# Patient Record
Sex: Male | Born: 1960 | Race: White | Hispanic: No | Marital: Married | Smoking: Never smoker
Health system: Southern US, Community
[De-identification: ages and names within clinical notes are randomized; demographics above are authoritative.]

## PROBLEM LIST (undated history)

## (undated) DIAGNOSIS — Z87442 Personal history of urinary calculi: Secondary | ICD-10-CM

---

## 2008-04-02 ENCOUNTER — Ambulatory Visit: Payer: Self-pay | Admitting: Gastroenterology

## 2009-11-05 ENCOUNTER — Other Ambulatory Visit: Payer: Self-pay | Admitting: Gastroenterology

## 2014-02-01 ENCOUNTER — Emergency Department: Payer: Self-pay | Admitting: Emergency Medicine

## 2014-02-01 LAB — CBC
HCT: 47.1 % (ref 40.0–52.0)
HGB: 16.1 g/dL (ref 13.0–18.0)
MCH: 30.6 pg (ref 26.0–34.0)
MCHC: 34.1 g/dL (ref 32.0–36.0)
MCV: 90 fL (ref 80–100)
Platelet: 244 10*3/uL (ref 150–440)
RBC: 5.25 10*6/uL (ref 4.40–5.90)
RDW: 13 % (ref 11.5–14.5)
WBC: 8.6 10*3/uL (ref 3.8–10.6)

## 2014-02-01 LAB — COMPREHENSIVE METABOLIC PANEL
ALBUMIN: 4.3 g/dL (ref 3.4–5.0)
Alkaline Phosphatase: 60 U/L
Anion Gap: 11 (ref 7–16)
BILIRUBIN TOTAL: 0.7 mg/dL (ref 0.2–1.0)
BUN: 11 mg/dL (ref 7–18)
CREATININE: 1.49 mg/dL — AB (ref 0.60–1.30)
Calcium, Total: 9.4 mg/dL (ref 8.5–10.1)
Chloride: 101 mmol/L (ref 98–107)
Co2: 24 mmol/L (ref 21–32)
EGFR (African American): >60
EGFR (Non-African Amer.): 53 — ABNORMAL LOW
GLUCOSE: 127 mg/dL — AB (ref 65–99)
Osmolality: 273 (ref 275–301)
POTASSIUM: 3.4 mmol/L — AB (ref 3.5–5.1)
SGOT(AST): 17 U/L (ref 15–37)
SGPT (ALT): 19 U/L
SODIUM: 136 mmol/L (ref 136–145)
Total Protein: 7.5 g/dL (ref 6.4–8.2)

## 2014-02-01 LAB — URINALYSIS, COMPLETE
Bacteria: NONE SEEN
Bilirubin,UR: NEGATIVE
GLUCOSE, UR: NEGATIVE mg/dL (ref 0–75)
Leukocyte Esterase: NEGATIVE
Nitrite: NEGATIVE
PH: 5 (ref 4.5–8.0)
Protein: NEGATIVE
Specific Gravity: 1.026 (ref 1.003–1.030)
Squamous Epithelial: NONE SEEN
WBC UR: 2 /HPF (ref 0–5)

## 2014-02-01 LAB — LIPASE, BLOOD: LIPASE: 112 U/L (ref 73–393)

## 2014-02-04 ENCOUNTER — Emergency Department: Payer: Self-pay | Admitting: Emergency Medicine

## 2014-02-04 LAB — CBC
HCT: 42.4 % (ref 40.0–52.0)
HGB: 14.4 g/dL (ref 13.0–18.0)
MCH: 30.4 pg (ref 26.0–34.0)
MCHC: 33.9 g/dL (ref 32.0–36.0)
MCV: 90 fL (ref 80–100)
Platelet: 160 10*3/uL (ref 150–440)
RBC: 4.73 10*6/uL (ref 4.40–5.90)
RDW: 12.9 % (ref 11.5–14.5)
WBC: 7.2 10*3/uL (ref 3.8–10.6)

## 2014-02-04 LAB — URINALYSIS, COMPLETE
BACTERIA: NONE SEEN
BILIRUBIN, UR: NEGATIVE
Blood: NEGATIVE
Glucose,UR: NEGATIVE mg/dL (ref 0–75)
Ketone: NEGATIVE
Leukocyte Esterase: NEGATIVE
NITRITE: NEGATIVE
Ph: 5 (ref 4.5–8.0)
Protein: NEGATIVE
RBC, UR: NONE SEEN /HPF (ref 0–5)
SQUAMOUS EPITHELIAL: NONE SEEN
Specific Gravity: 1.01 (ref 1.003–1.030)

## 2014-02-04 LAB — COMPREHENSIVE METABOLIC PANEL
AST: 15 U/L (ref 15–37)
Albumin: 3.9 g/dL (ref 3.4–5.0)
Alkaline Phosphatase: 46 U/L
Anion Gap: 5 — ABNORMAL LOW (ref 7–16)
BUN: 12 mg/dL (ref 7–18)
Bilirubin,Total: 1.2 mg/dL — ABNORMAL HIGH (ref 0.2–1.0)
CALCIUM: 9.9 mg/dL (ref 8.5–10.1)
CREATININE: 1.97 mg/dL — AB (ref 0.60–1.30)
Chloride: 100 mmol/L (ref 98–107)
Co2: 31 mmol/L (ref 21–32)
EGFR (African American): 44 — ABNORMAL LOW
EGFR (Non-African Amer.): 38 — ABNORMAL LOW
Glucose: 119 mg/dL — ABNORMAL HIGH (ref 65–99)
OSMOLALITY: 273 (ref 275–301)
Potassium: 3.7 mmol/L (ref 3.5–5.1)
SGPT (ALT): 21 U/L
Sodium: 136 mmol/L (ref 136–145)
Total Protein: 7.3 g/dL (ref 6.4–8.2)

## 2014-02-05 LAB — BASIC METABOLIC PANEL
ANION GAP: 8 (ref 7–16)
BUN: 14 mg/dL (ref 7–18)
Calcium, Total: 8.1 mg/dL — ABNORMAL LOW (ref 8.5–10.1)
Chloride: 106 mmol/L (ref 98–107)
Co2: 28 mmol/L (ref 21–32)
Creatinine: 1.89 mg/dL — ABNORMAL HIGH (ref 0.60–1.30)
EGFR (African American): 46 — ABNORMAL LOW
EGFR (Non-African Amer.): 40 — ABNORMAL LOW
Glucose: 98 mg/dL (ref 65–99)
OSMOLALITY: 284 (ref 275–301)
POTASSIUM: 4.1 mmol/L (ref 3.5–5.1)
Sodium: 142 mmol/L (ref 136–145)

## 2014-02-07 ENCOUNTER — Ambulatory Visit: Payer: Self-pay | Admitting: Urology

## 2014-02-08 ENCOUNTER — Ambulatory Visit: Payer: Self-pay | Admitting: Urology

## 2014-06-01 HISTORY — PX: OTHER SURGICAL HISTORY: SHX169

## 2016-06-19 IMAGING — CT CT STONE STUDY
2 of 4 series · 16 of 46 positions shown, 18 images · non-contrast
Comparison: None.

CLINICAL DATA: Left lower abdominal pain radiating to the left
flank.

EXAM:
CT ABDOMEN AND PELVIS WITHOUT CONTRAST
TECHNIQUE: Multidetector CT imaging of the abdomen and pelvis was performed
following the standard protocol without IV contrast.

[Series 2: stone standard full · axial · 0.72mm/px · z∈[-490,-50]mm · 13 of 97 slices shown, 15 images]
[im 5/97  soft-tissue]
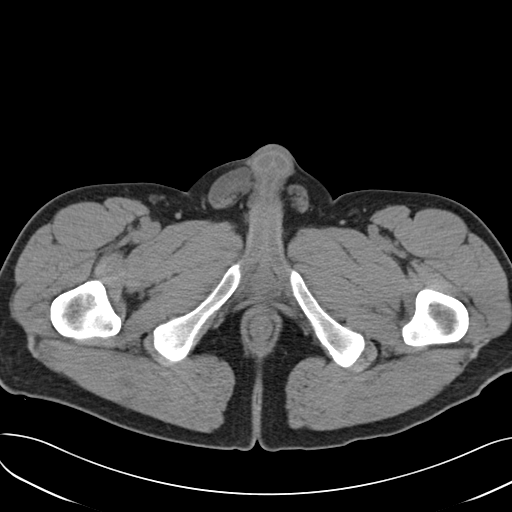
[im 5/97  bone]
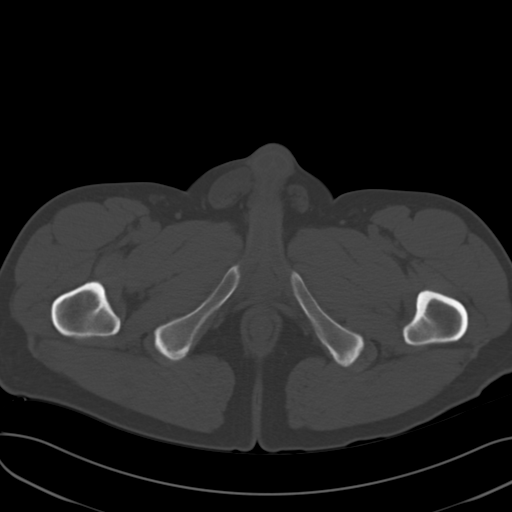
[im 13/97  soft-tissue]
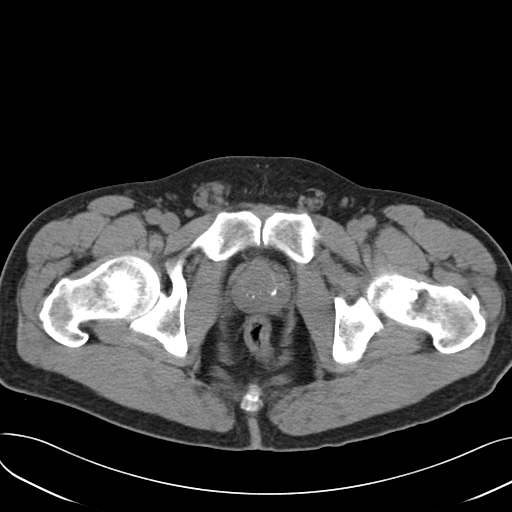
[im 21/97  soft-tissue]
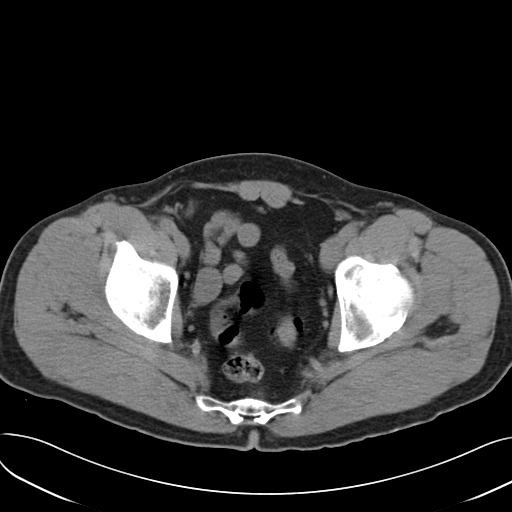
[im 29/97  soft-tissue]
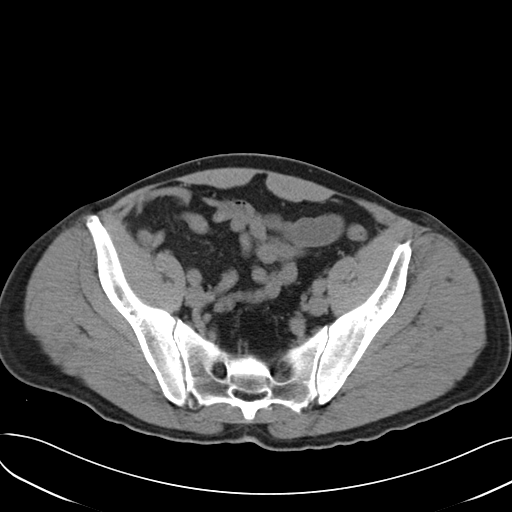
[im 33/97  soft-tissue]
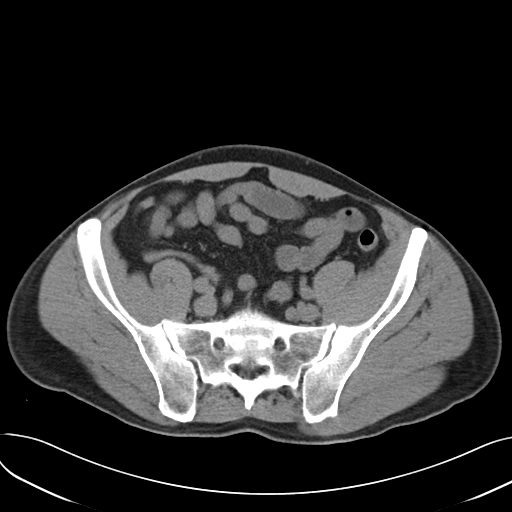
[im 41/97  soft-tissue]
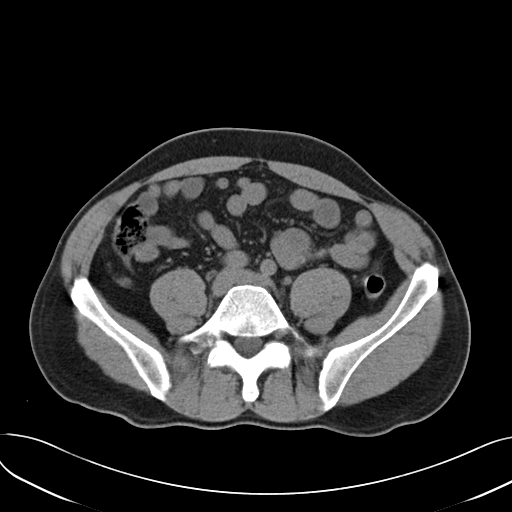
[im 49/97  soft-tissue]
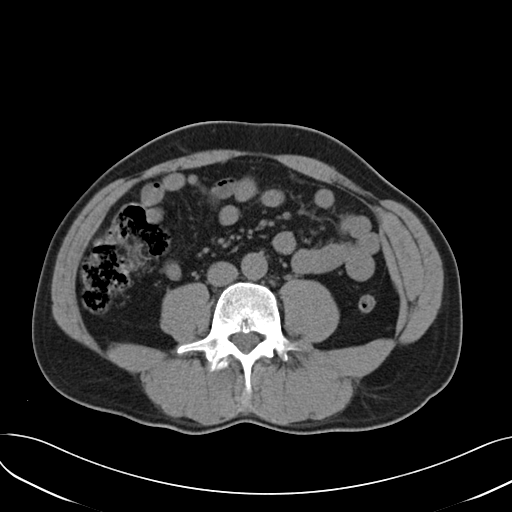
[im 57/97  soft-tissue]
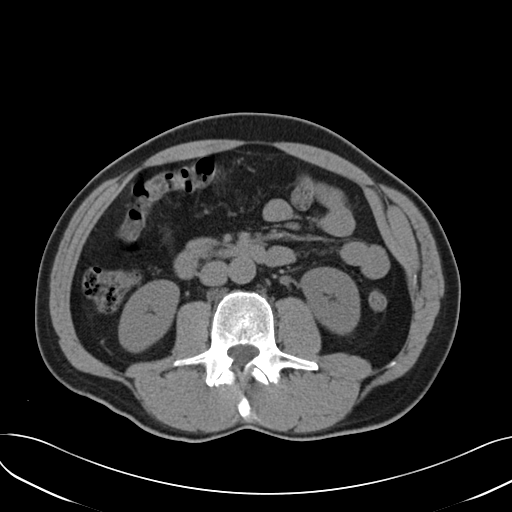
[im 65/97  soft-tissue]
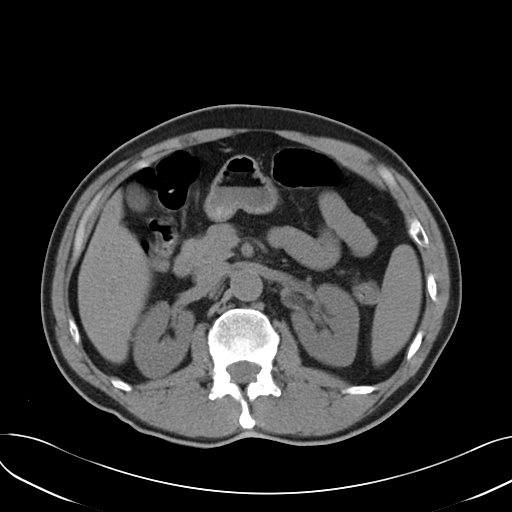
[im 65/97  bone]
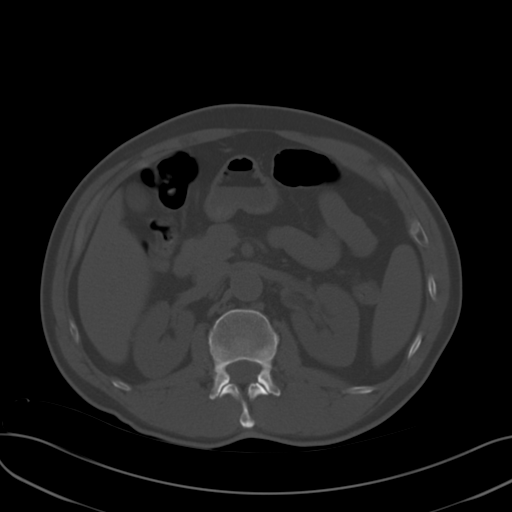
[im 69/97  soft-tissue]
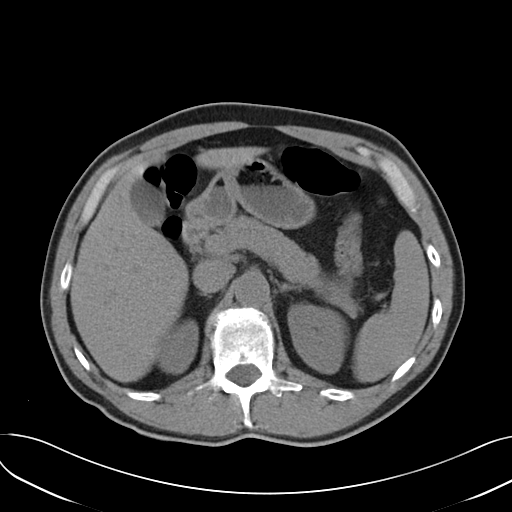
[im 77/97  soft-tissue]
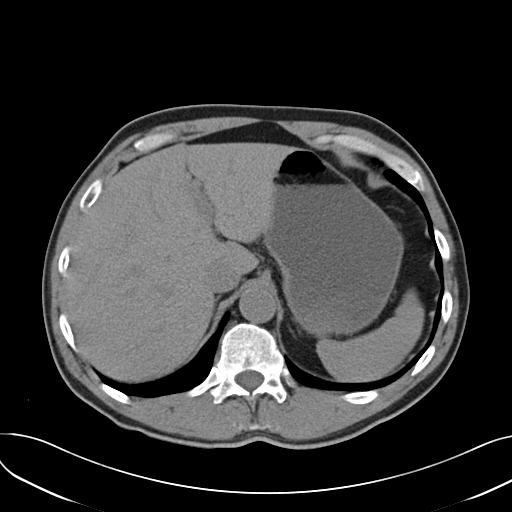
[im 85/97  soft-tissue]
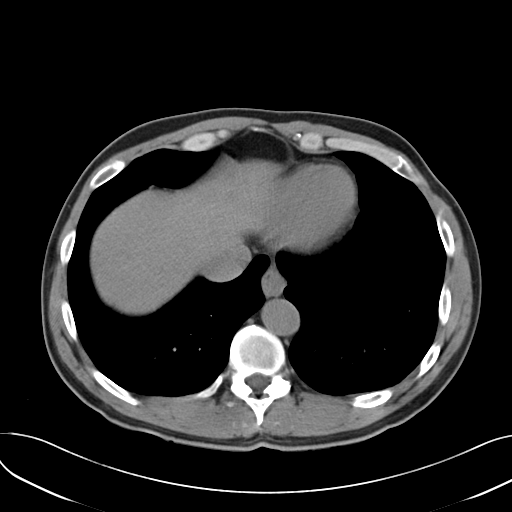
[im 93/97  soft-tissue]
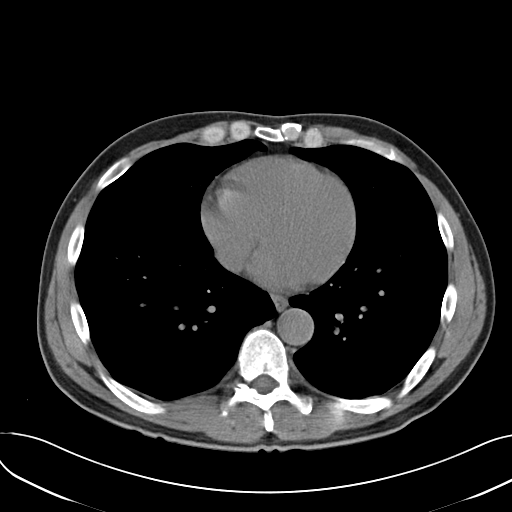

[Series 5: cor stone standard full · coronal · 0.66mm/px · 3 of 116 slices shown]
[im 39/116  soft-tissue]
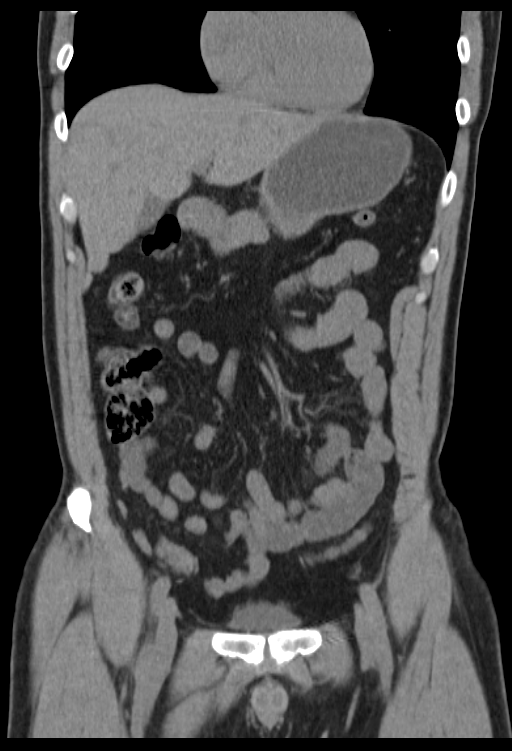
[im 52/116  soft-tissue]
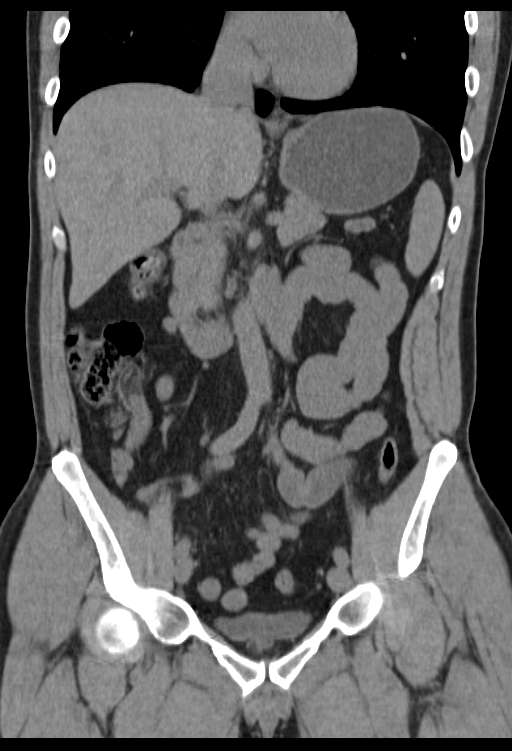
[im 64/116  soft-tissue]
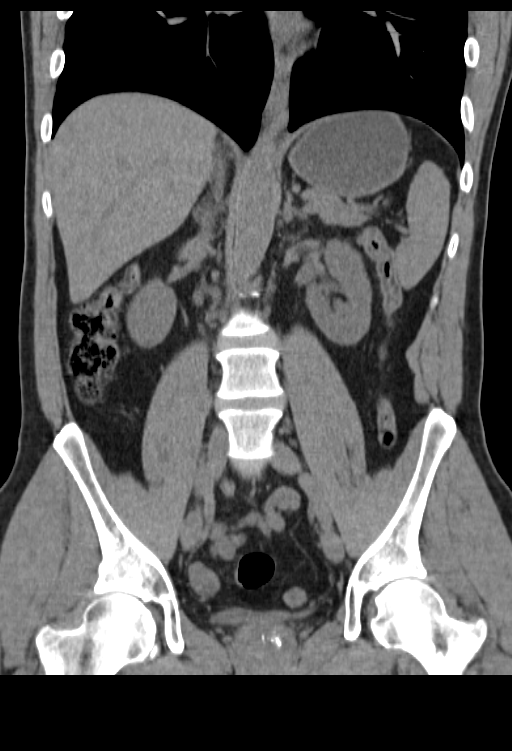

[16 of 46 positions shown; findings below may reference images not displayed]

FINDINGS: Visualization of the lower thorax demonstrates no consolidative or
nodular pulmonary opacities. Normal heart size.

Lack of intravenous contrast material limits evaluation the solid
organ parenchyma.

The noncontrast enhanced liver, gallbladder, spleen and pancreas are
unremarkable. Normal adrenal glands.

There is a 3 mm stone within the distal left ureter (image 78;
series 2). Mild left hydroureteronephrosis with periureteric fat
stranding. No additional nephrolithiasis.

Normal caliber abdominal aorta. No retroperitoneal lymphadenopathy.
Central dystrophic calcifications in the prostate. Bladder is
decompressed. Small splenule. Normal appendix. No abnormal bowel
wall thickening or evidence for bowel obstruction. No free fluid or
free intraperitoneal air. The

No aggressive or acute appearing osseous lesions.
IMPRESSION: 3 mm stone within the distal left ureter with mild left
hydroureteronephrosis.

## 2016-06-26 IMAGING — CR DG ABDOMEN 1V
1 series · 2 of 2 positions shown · non-contrast
Comparison: 02/04/2014 and CT 02/01/2014

CLINICAL DATA: Left kidney stone.

EXAM:
ABDOMEN - 1 VIEW

[Series 1: dxr kidney ureter bladder · 0.14mm/px · 2 of 2 slices shown]
[im 1/2]
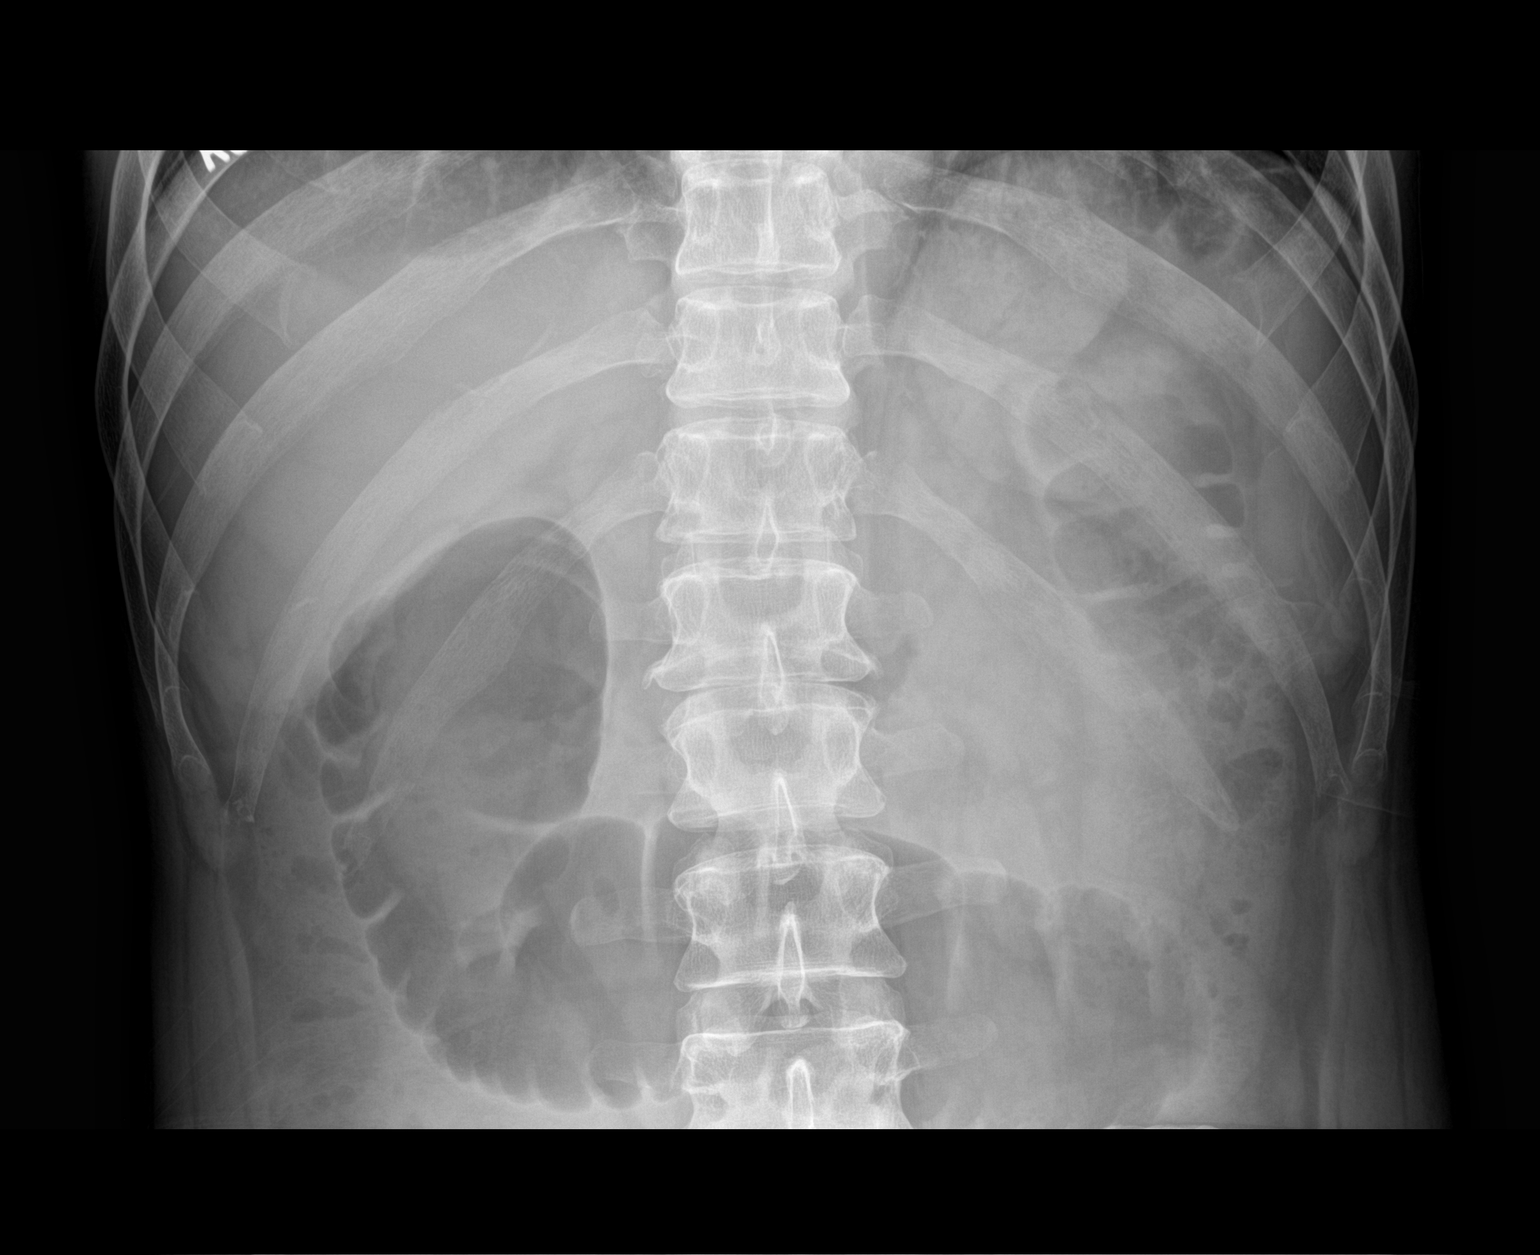
[im 2/2]
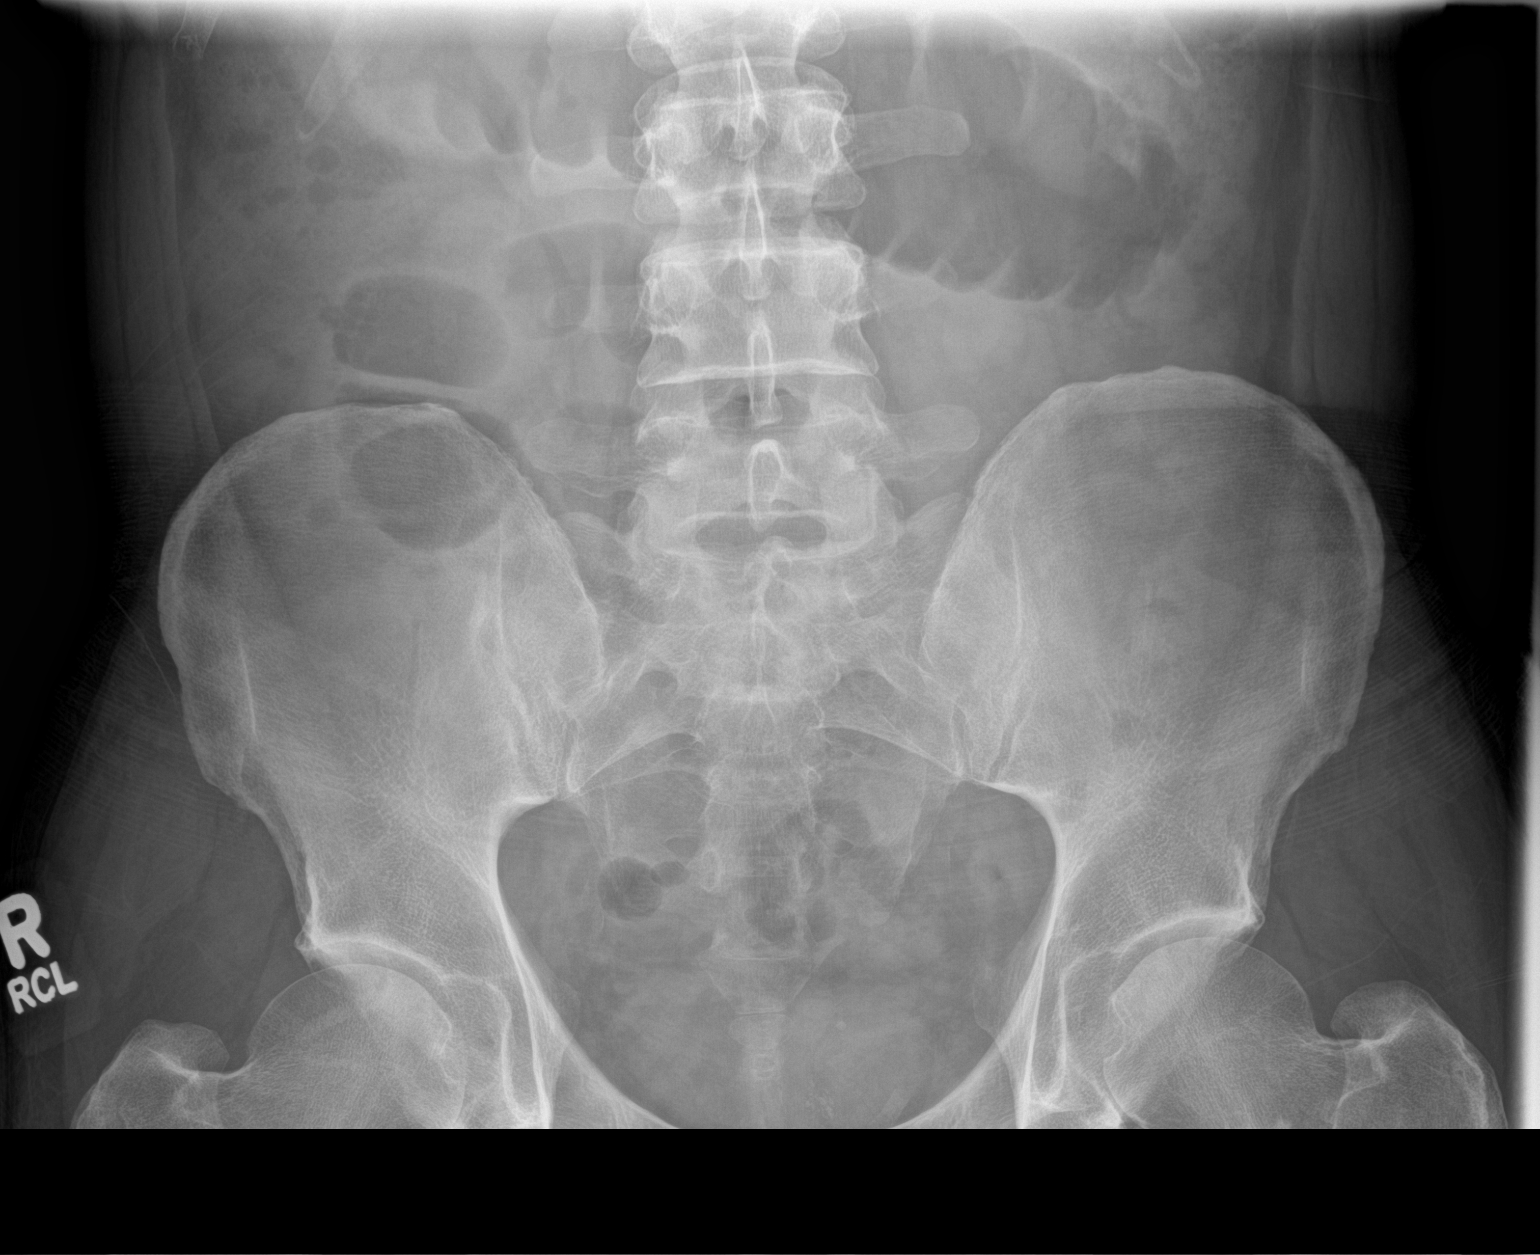

[2 of 2 positions shown; findings below may reference images not displayed]

FINDINGS: Bowel gas pattern is nonobstructive. There are no calcifications
present over the kidneys. Again noted is a 3 mm calcification over
the left pelvis slightly more medial on the current exam likely at
the UVJ compatible with the distal ureter ureteral stone seen on the
recent CT scan. Remainder of the exam is unchanged.
IMPRESSION: Nonobstructive bowel gas pattern.

3 mm distal left ureteral stone slightly more medial on the current
exam likely over the UVJ.

## 2017-01-26 DIAGNOSIS — E781 Pure hyperglyceridemia: Secondary | ICD-10-CM | POA: Diagnosis not present

## 2017-01-26 DIAGNOSIS — Z131 Encounter for screening for diabetes mellitus: Secondary | ICD-10-CM | POA: Diagnosis not present

## 2017-01-26 DIAGNOSIS — E538 Deficiency of other specified B group vitamins: Secondary | ICD-10-CM | POA: Diagnosis not present

## 2017-02-02 DIAGNOSIS — R972 Elevated prostate specific antigen [PSA]: Secondary | ICD-10-CM | POA: Diagnosis not present

## 2017-02-02 DIAGNOSIS — Z0000 Encounter for general adult medical examination without abnormal findings: Secondary | ICD-10-CM | POA: Diagnosis not present

## 2017-04-27 DIAGNOSIS — R972 Elevated prostate specific antigen [PSA]: Secondary | ICD-10-CM | POA: Diagnosis not present

## 2017-05-04 DIAGNOSIS — Z125 Encounter for screening for malignant neoplasm of prostate: Secondary | ICD-10-CM | POA: Diagnosis not present

## 2017-05-04 DIAGNOSIS — Z23 Encounter for immunization: Secondary | ICD-10-CM | POA: Diagnosis not present

## 2017-05-04 DIAGNOSIS — Z13220 Encounter for screening for lipoid disorders: Secondary | ICD-10-CM | POA: Diagnosis not present

## 2017-05-04 DIAGNOSIS — R972 Elevated prostate specific antigen [PSA]: Secondary | ICD-10-CM | POA: Diagnosis not present

## 2017-09-30 DIAGNOSIS — R6889 Other general symptoms and signs: Secondary | ICD-10-CM | POA: Diagnosis not present

## 2017-09-30 DIAGNOSIS — J029 Acute pharyngitis, unspecified: Secondary | ICD-10-CM | POA: Diagnosis not present

## 2017-09-30 DIAGNOSIS — J0110 Acute frontal sinusitis, unspecified: Secondary | ICD-10-CM | POA: Diagnosis not present

## 2018-02-08 DIAGNOSIS — Z131 Encounter for screening for diabetes mellitus: Secondary | ICD-10-CM | POA: Diagnosis not present

## 2018-02-15 DIAGNOSIS — E782 Mixed hyperlipidemia: Secondary | ICD-10-CM | POA: Diagnosis not present

## 2018-02-15 DIAGNOSIS — Z0000 Encounter for general adult medical examination without abnormal findings: Secondary | ICD-10-CM | POA: Diagnosis not present

## 2018-02-15 DIAGNOSIS — Z131 Encounter for screening for diabetes mellitus: Secondary | ICD-10-CM | POA: Diagnosis not present

## 2018-03-01 DIAGNOSIS — E538 Deficiency of other specified B group vitamins: Secondary | ICD-10-CM | POA: Diagnosis not present

## 2018-03-01 DIAGNOSIS — K582 Mixed irritable bowel syndrome: Secondary | ICD-10-CM | POA: Diagnosis not present

## 2018-03-01 DIAGNOSIS — Z1211 Encounter for screening for malignant neoplasm of colon: Secondary | ICD-10-CM | POA: Diagnosis not present

## 2018-03-04 DIAGNOSIS — K582 Mixed irritable bowel syndrome: Secondary | ICD-10-CM | POA: Diagnosis not present

## 2018-06-09 DIAGNOSIS — D122 Benign neoplasm of ascending colon: Secondary | ICD-10-CM | POA: Diagnosis not present

## 2018-06-09 DIAGNOSIS — Z1211 Encounter for screening for malignant neoplasm of colon: Secondary | ICD-10-CM | POA: Diagnosis not present

## 2018-06-09 DIAGNOSIS — K640 First degree hemorrhoids: Secondary | ICD-10-CM | POA: Diagnosis not present

## 2018-06-09 DIAGNOSIS — D120 Benign neoplasm of cecum: Secondary | ICD-10-CM | POA: Diagnosis not present

## 2020-10-22 ENCOUNTER — Ambulatory Visit (HOSPITAL_COMMUNITY)
Admission: EM | Admit: 2020-10-22 | Disposition: A | Discharge status: Home / Self Care | Payer: Self-pay | Source: Home / Self Care

## 2021-09-29 HISTORY — PX: COLONOSCOPY: SHX174

## 2021-11-27 ENCOUNTER — Other Ambulatory Visit: Payer: Self-pay | Admitting: Urology

## 2021-11-27 DIAGNOSIS — R972 Elevated prostate specific antigen [PSA]: Secondary | ICD-10-CM

## 2021-12-11 ENCOUNTER — Ambulatory Visit
Admission: RE | Admit: 2021-12-11 | Discharge: 2021-12-11 | Disposition: A | Discharge status: Home / Self Care | Payer: BC Managed Care – PPO | Source: Ambulatory Visit | Attending: Urology | Admitting: Urology

## 2021-12-11 DIAGNOSIS — R972 Elevated prostate specific antigen [PSA]: Secondary | ICD-10-CM | POA: Insufficient documentation

## 2021-12-11 MED ORDER — GADOBUTROL 1 MMOL/ML IV SOLN
7.5000 mL | Freq: Once | INTRAVENOUS | Status: AC | PRN
  Administered 2021-12-11: 7.5 mL via INTRAVENOUS

## 2022-01-05 ENCOUNTER — Encounter
Admission: RE | Admit: 2022-01-05 | Discharge: 2022-01-05 | Disposition: A | Discharge status: Home / Self Care | Payer: BC Managed Care – PPO | Source: Ambulatory Visit | Attending: Urology | Admitting: Urology

## 2022-01-05 HISTORY — DX: Personal history of urinary calculi: Z87.442

## 2022-01-05 NOTE — Patient Instructions (Addendum)
Your procedure is scheduled on: Thursday January 15, 2022. Report to Day Surgery inside [facility] 2nd floor, stop by admissions desk before getting on elevator. To find out your arrival time please call [phone] between 1PM - 3PM on Wednesday January 14, 2022.  Remember: Instructions that are not followed completely may result in serious medical risk,  up to and including death, or upon the discretion of your surgeon and anesthesiologist your  surgery may need to be rescheduled.     _X__ 1. Do not eat food after midnight the night before your procedure.                 No chewing gum or hard candies. You may drink clear liquids up to 2 hours                 before you are scheduled to arrive for your surgery- DO not drink clear                 liquids within 2 hours of the start of your surgery.                 Clear Liquids include:  water, apple juice without pulp, clear Gatorade, G2 or                  Gatorade Zero (avoid Red/Purple/Blue), Black Coffee or Tea (Do not add                 anything to coffee or tea).  __X__2.  On the morning of surgery brush your teeth with toothpaste and water, you                may rinse your mouth with mouthwash if you wish.  Do not swallow any toothpaste or mouthwash.     _X__ 3.  No Alcohol for 24 hours before or after surgery.   _X__ 4.  Do Not Smoke or use e-cigarettes For 24 Hours Prior to Your Surgery.                 Do not use any chewable tobacco products for at least 6 hours prior to                 Surgery.  _X__  5.  Do not use any recreational drugs (marijuana, cocaine, heroin, ecstasy, MDMA or other)                For at least one week prior to your surgery.  Combination of these drugs with anesthesia                May have life threatening results.  ____  6.  Bring all medications with you on the day of surgery if instructed.   __X__  7.  Notify your doctor if there is any change in your medical  condition      (cold, fever, infections).     Do not wear jewelry, make-up, hairpins, clips or nail polish. Do not wear lotions, powders, or perfumes. You may wear deodorant. Do not shave 48 hours prior to surgery. Men may shave face and neck. Do not bring valuables to the hospital.    [facility] is not responsible for any belongings or valuables.  Contacts, dentures or bridgework may not be worn into surgery. Leave your suitcase in the car. After surgery it may be brought to your room. For patients admitted to the hospital, discharge time is determined  by your treatment team.   Patients discharged the day of surgery will not be allowed to drive home.   Make arrangements for someone to be with you for the first 24 hours of your Same Day Discharge.   __X__ Take these medicines the morning of surgery with A SIP OF WATER:    1. None   2.   3.   4.  5.  6.  __X__ Fleet Enema (as directed use one the night before at 10pm and one the morning off procedure as soon as you wake before getting in your showe   ____ Use CHG Soap (or wipes) as directed  ____ Use Benzoyl Peroxide Gel as instructed  ____ Use inhalers on the day of surgery  ____ Stop metformin 2 days prior to surgery    ____ Take 1/2 of usual insulin dose the night before surgery. No insulin the morning          of surgery.   ____ Call your PCP, cardiologist, or Pulmonologist if taking Coumadin/Plavix/aspirin and ask when to stop before your surgery.   __X__ One Week prior to surgery- Stop Anti-inflammatories such as Ibuprofen, Aleve, Advil, Motrin, meloxicam (MOBIC), diclofenac, etodolac, ketorolac, Toradol, Daypro, piroxicam, Goody's or BC powders. OK TO USE TYLENOL IF NEEDED   __X__ Stop supplements until after surgery.    ____ Bring C-Pap to the hospital.    If you have any questions regarding your pre-procedure instructions,  Please call Pre-admit Testing at [phone]

## 2022-01-06 NOTE — H&P (Unsigned)
NAME: Dillon Humphrey, Dillon A. MEDICAL RECORD NO: [id number] ACCOUNT NO: [id number] DATE OF BIRTH: Aug 03, 1960 FACILITY: [facility] LOCATION: [facility] PHYSICIAN: Otelia Limes. Yves Dill, MD  History and Physical   DATE OF ADMISSION: 01/05/2022  Same day surgery, August 17th.  CHIEF COMPLAINT:  Abnormal prostate, MRI scan and elevated PSA.  HISTORY OF PRESENT ILLNESS:  The patient is a 61-year-old white male found to have an elevated PSA of 6.90 ng/mL by his primary care physician.  He was evaluated with exosome IntelliScore of 30.91, which was above the cutoff for higher risk of  high-grade prostate cancer.  Prostate MRI scan dated July 13th indicated a 36.3 mL prostate with a 2 PI-RADS category 5 lesions of the right anterior transition zone with probable transcapsular spread.  He comes in now for UroNav fusion biopsy of the  prostate gland.  ____:  No drug allergies.  CURRENT MEDICATIONS:  Include omega fish oil.  SURGICAL HISTORY:  ESWL for calcium oxalate stone disease 2015.  PAST AND CURRENT MEDICAL CONDITIONS:   1.  Irritable bowel syndrome. 2.  History of calcium oxalate kidney stones.  REVIEW OF SYSTEMS:  The patient denies chest pain, shortness of breath, diabetes, stroke, heart disease, weight loss, fatigue, or night sweats.  SOCIAL HISTORY:  The patient denied tobacco or alcohol use.  FAMILY HISTORY:  Father died at age 61 of brain tumor, but also had prostate cancer.  Mother is living, age 61 with hypertension.  He has a brother who is living AT age 75 with diabetes and hypercholesterolemia.  PHYSICAL EXAMINATION:   VITAL SIGNS:  Height 5 feet 10 inches, weight 172 pounds, BMI 25. GENERAL:  Well-nourished white male in no acute distress. HEENT:  Sclerae were clear.  Pupils are equally round, reactive to light and accommodation.  Extraocular movements are intact. NECK:  No palpable masses or tenderness.  No audible carotid bruits. LYMPHATIC:  No palpable cervical or inguinal  adenopathy. PULMONARY:  Clear to auscultation. CARDIOVASCULAR:  Regular rhythm and rate. ABDOMEN:  Soft, nontender abdomen.  No CVA tenderness. GENITOURINARY:  Circumcised.  Testes smooth, nontender, approximately 18 mL in size each. RECTAL:  A 30 gram smooth, nontender prostate. NEUROMUSCULAR:  Alert and oriented x3.  IMPRESSION:   1.  Elevated PSA. 2.  Abnormal prostate MRI scan. 3.  Abnormal prostate exosome results.  PLAN:  UroNav fusion biopsy of the prostate.   CHR D: 01/06/2022 4:26:20 pm T: 01/06/2022 5:24:00 pm  JOB: [id number]/ [id number]

## 2022-01-14 MED ORDER — ORAL CARE MOUTH RINSE: 15.0000 mL | Freq: Once | OROMUCOSAL | Status: AC

## 2022-01-14 MED ORDER — FAMOTIDINE 20 MG PO TABS: 20.0000 mg | ORAL_TABLET | Freq: Once | ORAL | Status: AC

## 2022-01-14 MED ORDER — GENTAMICIN SULFATE 40 MG/ML IJ SOLN
80.0000 mg | Freq: Once | INTRAVENOUS | Status: DC
  Filled 2022-01-14: qty 2

## 2022-01-14 MED ORDER — SODIUM CHLORIDE 0.9 % IV SOLN: INTRAVENOUS | Status: DC

## 2022-01-14 MED ORDER — CHLORHEXIDINE GLUCONATE 0.12 % MT SOLN: 15.0000 mL | Freq: Once | OROMUCOSAL | Status: AC

## 2022-01-14 MED ORDER — CEFAZOLIN SODIUM-DEXTROSE 1-4 GM/50ML-% IV SOLN
1.0000 g | Freq: Once | INTRAVENOUS | Status: AC
  Administered 2022-01-15: 1 g via INTRAVENOUS

## 2022-01-15 ENCOUNTER — Encounter
Admission: RE | Disposition: A | Discharge status: Home / Self Care | Payer: Self-pay | Source: Home / Self Care | Attending: Urology

## 2022-01-15 ENCOUNTER — Encounter: Payer: Self-pay | Admitting: Urology

## 2022-01-15 ENCOUNTER — Ambulatory Visit: Payer: BC Managed Care – PPO | Admitting: Anesthesiology

## 2022-01-15 ENCOUNTER — Ambulatory Visit
Admission: RE | Admit: 2022-01-15 | Discharge: 2022-01-15 | Disposition: A | Discharge status: Home / Self Care | Payer: BC Managed Care – PPO | Attending: Urology | Admitting: Urology

## 2022-01-15 DIAGNOSIS — C61 Malignant neoplasm of prostate: Secondary | ICD-10-CM | POA: Diagnosis present

## 2022-01-15 HISTORY — PX: PROSTATE BIOPSY: SHX241

## 2022-01-15 SURGERY — BIOPSY, PROSTATE
Anesthesia: General | Site: Prostate

## 2022-01-15 MED ORDER — FENTANYL CITRATE (PF) 100 MCG/2ML IJ SOLN
INTRAMUSCULAR | Status: DC | PRN
  Administered 2022-01-15 (×2): 25 ug via INTRAVENOUS
  Administered 2022-01-15: 50 ug via INTRAVENOUS

## 2022-01-15 MED ORDER — FENTANYL CITRATE (PF) 100 MCG/2ML IJ SOLN
INTRAMUSCULAR | Status: AC
  Filled 2022-01-15: qty 2

## 2022-01-15 MED ORDER — ONDANSETRON HCL 4 MG/2ML IJ SOLN
INTRAMUSCULAR | Status: AC
  Filled 2022-01-15: qty 2

## 2022-01-15 MED ORDER — PROPOFOL 10 MG/ML IV BOLUS
INTRAVENOUS | Status: DC | PRN
  Administered 2022-01-15: 50 mg via INTRAVENOUS

## 2022-01-15 MED ORDER — GENTAMICIN IN SALINE 1.6-0.9 MG/ML-% IV SOLN
80.0000 mg | INTRAVENOUS | Status: AC
  Administered 2022-01-15: 80 mg via INTRAVENOUS
  Filled 2022-01-15: qty 50

## 2022-01-15 MED ORDER — PROPOFOL 1000 MG/100ML IV EMUL
INTRAVENOUS | Status: AC
Start: 2022-01-15 — End: ?
  Filled 2022-01-15: qty 100

## 2022-01-15 MED ORDER — OXYCODONE HCL 5 MG/5ML PO SOLN: 5.0000 mg | Freq: Once | ORAL | Status: DC | PRN

## 2022-01-15 MED ORDER — OXYCODONE HCL 5 MG PO TABS: 5.0000 mg | ORAL_TABLET | Freq: Once | ORAL | Status: DC | PRN

## 2022-01-15 MED ORDER — PHENYLEPHRINE 80 MCG/ML (10ML) SYRINGE FOR IV PUSH (FOR BLOOD PRESSURE SUPPORT)
PREFILLED_SYRINGE | INTRAVENOUS | Status: DC | PRN
  Administered 2022-01-15: 80 ug via INTRAVENOUS

## 2022-01-15 MED ORDER — CEFAZOLIN SODIUM-DEXTROSE 1-4 GM/50ML-% IV SOLN
INTRAVENOUS | Status: AC
  Filled 2022-01-15: qty 50

## 2022-01-15 MED ORDER — ONDANSETRON HCL 4 MG/2ML IJ SOLN
INTRAMUSCULAR | Status: DC | PRN
  Administered 2022-01-15: 4 mg via INTRAVENOUS

## 2022-01-15 MED ORDER — PROPOFOL 10 MG/ML IV BOLUS
INTRAVENOUS | Status: AC
  Filled 2022-01-15: qty 20

## 2022-01-15 MED ORDER — DEXMEDETOMIDINE (PRECEDEX) IN NS 20 MCG/5ML (4 MCG/ML) IV SYRINGE
PREFILLED_SYRINGE | INTRAVENOUS | Status: DC | PRN
  Administered 2022-01-15: 4 ug via INTRAVENOUS

## 2022-01-15 MED ORDER — LIDOCAINE HCL (PF) 2 % IJ SOLN
INTRAMUSCULAR | Status: AC
  Filled 2022-01-15: qty 5

## 2022-01-15 MED ORDER — CHLORHEXIDINE GLUCONATE 0.12 % MT SOLN
OROMUCOSAL | Status: AC
  Administered 2022-01-15: 15 mL via OROMUCOSAL
  Filled 2022-01-15: qty 15

## 2022-01-15 MED ORDER — PROMETHAZINE HCL 25 MG/ML IJ SOLN: 6.2500 mg | INTRAMUSCULAR | Status: DC | PRN

## 2022-01-15 MED ORDER — ACETAMINOPHEN 10 MG/ML IV SOLN: 1000.0000 mg | Freq: Once | INTRAVENOUS | Status: DC | PRN

## 2022-01-15 MED ORDER — LEVOFLOXACIN 500 MG PO TABS: 500.0000 mg | ORAL_TABLET | Freq: Every day | ORAL | 0 refills | Status: DC

## 2022-01-15 MED ORDER — MIDAZOLAM HCL 2 MG/2ML IJ SOLN
INTRAMUSCULAR | Status: AC
  Filled 2022-01-15: qty 2

## 2022-01-15 MED ORDER — PROPOFOL 500 MG/50ML IV EMUL
INTRAVENOUS | Status: DC | PRN
  Administered 2022-01-15: 165 ug/kg/min via INTRAVENOUS

## 2022-01-15 MED ORDER — FAMOTIDINE 20 MG PO TABS
ORAL_TABLET | ORAL | Status: AC
  Administered 2022-01-15: 20 mg via ORAL
  Filled 2022-01-15: qty 1

## 2022-01-15 MED ORDER — MIDAZOLAM HCL 2 MG/2ML IJ SOLN
INTRAMUSCULAR | Status: DC | PRN
  Administered 2022-01-15: 1 mg via INTRAVENOUS

## 2022-01-15 MED ORDER — FENTANYL CITRATE (PF) 100 MCG/2ML IJ SOLN: 25.0000 ug | INTRAMUSCULAR | Status: DC | PRN

## 2022-01-15 SURGICAL SUPPLY — 14 items
COVER MAYO STAND REUSABLE (DRAPES) ×2 IMPLANT
COVER TRANSDUCER UNLTRASOUND (MISCELLANEOUS) IMPLANT
GLOVE BIOGEL M STRL SZ7.5 (GLOVE) ×2 IMPLANT
GUIDE NDL URONAV ULTRASND S (MISCELLANEOUS) IMPLANT
GUIDE NEEDLE URONAV ULTRASND S (MISCELLANEOUS) ×1 IMPLANT
INST BIOPSY MAXCORE 18GX25 (NEEDLE) ×2 IMPLANT
PROBE URONAV BK 8808E 8818 HLD (MISCELLANEOUS) IMPLANT
STRAP SAFETY 5IN WIDE (MISCELLANEOUS) ×2 IMPLANT
SURGILUBE 2OZ TUBE FLIPTOP (MISCELLANEOUS) ×2 IMPLANT
TOWEL OR 17X26 4PK STRL BLUE (TOWEL DISPOSABLE) ×2 IMPLANT
URONAV BK 8808E 8818 PROBE HLD (MISCELLANEOUS) ×1
URONAV MRI FUSION TWO PATIENTS (MISCELLANEOUS) IMPLANT
URONAV ULTRASOUND (MISCELLANEOUS) IMPLANT
URONAV ULTRASOUND NDL GUIDE S (MISCELLANEOUS) ×1

## 2022-01-15 NOTE — Transfer of Care (Signed)
Immediate Anesthesia Transfer of Care Note  Patient: Dillon Humphrey  Procedure(s) Performed: PROSTATE BIOPSY URONAV (Prostate)  Patient Location: PACU  Anesthesia Type:General  Level of Consciousness: drowsy  Airway & Oxygen Therapy: Patient Spontanous Breathing and Patient connected to face mask oxygen  Post-op Assessment: Report given to RN and Post -op Vital signs reviewed and stable  Post vital signs: Reviewed and stable  Last Vitals:  Vitals Value Taken Time  BP 90/65 01/15/22 1033  Temp    Pulse 59 01/15/22 1033  Resp 10 01/15/22 1033  SpO2 98 % 01/15/22 1033  Vitals shown include unvalidated device data.  Last Pain:  Vitals:   01/15/22 0815  TempSrc: Temporal         Complications: No notable events documented.

## 2022-01-15 NOTE — Anesthesia Preprocedure Evaluation (Addendum)
Anesthesia Evaluation  Patient identified by MRN, date of birth, ID band Patient awake    Reviewed: Allergy & Precautions, NPO status , Patient's Chart, lab work & pertinent test results  Airway Mallampati: III  TM Distance: >3 FB Neck ROM: full    Dental no notable dental hx.    Pulmonary neg pulmonary ROS,    Pulmonary exam normal        Cardiovascular negative cardio ROS Normal cardiovascular exam     Neuro/Psych negative neurological ROS  negative psych ROS   GI/Hepatic negative GI ROS, Neg liver ROS,   Endo/Other  negative endocrine ROS  Renal/GU negative Renal ROS  negative genitourinary   Musculoskeletal   Abdominal Normal abdominal exam  (+)   Peds  Hematology negative hematology ROS (+)   Anesthesia Other Findings Elevated PSA  Past Medical History: No date: History of kidney stones  Past Surgical History: 09/2021: COLONOSCOPY 2016: lithotrypsi  BMI    Body Mass Index: 24.41 kg/m      Reproductive/Obstetrics negative OB ROS                            Anesthesia Physical Anesthesia Plan  ASA: 1  Anesthesia Plan: General   Post-op Pain Management: Minimal or no pain anticipated   Induction: Intravenous  PONV Risk Score and Plan: Propofol infusion and TIVA  Airway Management Planned: Natural Airway  Additional Equipment:   Intra-op Plan:   Post-operative Plan:   Informed Consent: I have reviewed the patients History and Physical, chart, labs and discussed the procedure including the risks, benefits and alternatives for the proposed anesthesia with the patient or authorized representative who has indicated his/her understanding and acceptance.     Dental Advisory Given  Plan Discussed with: Anesthesiologist, CRNA and Surgeon  Anesthesia Plan Comments:        Anesthesia Quick Evaluation

## 2022-01-15 NOTE — Discharge Instructions (Signed)
Transrectal Ultrasound-Guided Prostate Biopsy, Care After What can I expect after the procedure? After the procedure, it is common to have: Pain and discomfort near your butt (rectum), especially while sitting. Pink-colored pee (urine). This is due to small amounts of blood in your pee. A burning feeling while peeing. Blood in your poop (stool). Bleeding from your butt. Blood in your semen. Follow these instructions at home: Medicines Take over-the-counter and prescription medicines only as told by your doctor. If you were given a sedative during your procedure, do not drive or use machines until your doctor says that it is safe. A sedative is a medicine that helps you relax. If you were prescribed an antibiotic medicine, take it as told by your doctor. Do not stop taking it even if you start to feel better. Activity A sign showing that a person should not lift anything heavy.   Return to your normal activities when your doctor says that it is safe. Ask your doctor when it is okay for you to have sex. You may have to avoid lifting. Ask your doctor how much you can safely lift. General instructions A comparison of three sample cups showing dark yellow, yellow, and pale yellow urine.   Drink enough water to keep your pee pale yellow. Watch your pee, poop, and semen for new bleeding or bleeding that gets worse. Keep all follow-up visits. Contact a doctor if: You have any of these: Blood clots in your pee or poop. Blood in your pee more than 2 weeks after the procedure. Blood in your semen more than 2 months after the procedure. New or worse bleeding in your pee, poop, or semen. Very bad belly pain. Your pee smells bad or unusual. You have trouble peeing. Your lower belly feels firm. You have problems getting an erection. You feel like you may vomit (are nauseous), or you vomit. Get help right away if: You have a fever or chills. You have bright red pee. You have very bad pain  that does not get better with medicine. You cannot pee. Summary After this procedure, it is common to have pain and discomfort near your butt, especially while sitting. You may have blood in your pee and poop. It is common to have blood in your semen. Get help right away if you have a fever or chills. This information is not intended to replace advice given to you by your health care provider. Make sure you discuss any questions you have with your health care provider. Document Revised: 11/11/2020 Document Reviewed: 11/11/2020 Elsevier Patient Education  [place].

## 2022-01-15 NOTE — Op Note (Signed)
Preoperative diagnosis: 1.  Elevated PSA (R97.2)                                           2.  Abnormal prostate gland MRI with 2 PI-RADS category 5 lesions (D40.0)  Postoperative diagnosis: Same  Procedure: UroNav fusion biopsy of the prostate gland (CPT [phone], 55700)  Surgeon: Otelia Limes. Yves Dill MD  Anesthesia: General  Indications:See the history and physical also. 62 year old ( DATE OF BIRTH: 03-Feb-1961) white male found to have an elevated PSA of 6.90 ng/mL by his primary care physician.  He was evaluated with exosome IntelliScore of 30.91, which was above the cutoff for higher risk of high-grade prostate cancer.  Prostate MRI scan dated July 13th indicated a 36.3 mL prostate with a 2 PI-RADS category 5 lesions of the right anterior transition zone with probable transcapsular spread.  He comes in now for UroNav fusion biopsy of the prostate gland.After informed consent the above procedure(s) were requested     Technique and findings: After adequate general anesthesia obtained patient was placed into left lateral decubitus position and DRE was performed.  The rectal vault was clear.  The ultrasound probe was then placed and prostate images acquired.  The ultrasound images were then fused with the MRI images.  4 core biopsies were taken from the region of interest #1 and 4 core biopsies were taken from region of interest #2.  At this point standard 12 core systematic core biopsies were performed.  The ultrasound probe was then removed.  Blood loss was minimal.  The procedure was then terminated and patient transferred to the recovery room in stable condition.

## 2022-01-15 NOTE — Anesthesia Postprocedure Evaluation (Signed)
Anesthesia Post Note  Patient: Dillon Humphrey  Procedure(s) Performed: PROSTATE BIOPSY URONAV (Prostate)  Patient location during evaluation: PACU Anesthesia Type: General Level of consciousness: awake and alert Pain management: pain level controlled Vital Signs Assessment: post-procedure vital signs reviewed and stable Respiratory status: spontaneous breathing, nonlabored ventilation and respiratory function stable Cardiovascular status: blood pressure returned to baseline and stable Postop Assessment: no apparent nausea or vomiting Anesthetic complications: no   No notable events documented.   Last Vitals:  Vitals:   01/15/22 1100 01/15/22 1123  BP: (!) 106/91 111/76  Pulse: 70 63  Resp: 17 15  Temp:  36.8 C  SpO2: 95% 99%    Last Pain:  Vitals:   01/15/22 1123  TempSrc: Temporal  PainSc: 0-No pain                 [place] Ouch

## 2022-01-15 NOTE — H&P (Signed)
Date of Initial H&P: 01/06/22  History reviewed, patient examined, no change in status, stable for surgery.

## 2022-01-16 LAB — SURGICAL PATHOLOGY

## 2022-01-19 ENCOUNTER — Encounter: Payer: Self-pay | Admitting: Urology

## 2022-01-29 ENCOUNTER — Ambulatory Visit
Admission: RE | Admit: 2022-01-29 | Discharge: 2022-01-29 | Disposition: A | Discharge status: Home / Self Care | Payer: BC Managed Care – PPO | Source: Ambulatory Visit | Attending: Radiation Oncology | Admitting: Radiation Oncology

## 2022-01-29 ENCOUNTER — Other Ambulatory Visit: Payer: Self-pay | Admitting: *Deleted

## 2022-01-29 ENCOUNTER — Encounter: Payer: Self-pay | Admitting: Radiation Oncology

## 2022-01-29 VITALS — BP 119/74 | HR 75 | Temp 98.7°F | Resp 16 | Ht 71.0 in | Wt 174.5 lb

## 2022-01-29 DIAGNOSIS — Z87442 Personal history of urinary calculi: Secondary | ICD-10-CM | POA: Insufficient documentation

## 2022-01-29 DIAGNOSIS — C61 Malignant neoplasm of prostate: Secondary | ICD-10-CM | POA: Insufficient documentation

## 2022-01-29 NOTE — Consult Note (Signed)
NEW PATIENT EVALUATION  Name: Dillon Humphrey  MRN: [id number]  Date:   01/29/2022     DOB: 01-04-1961   This 61 y.o. male patient presents to the clinic for initial evaluation of possible stage IIIb based on MRI capsular bulge in patient with possible stage I disease.  REFERRING PHYSICIAN: Derinda Late, MD  CHIEF COMPLAINT:  Chief Complaint  Patient presents with   Prostate Cancer    DIAGNOSIS: The encounter diagnosis was Malignant neoplasm of prostate ([place]).   PREVIOUS INVESTIGATIONS:  MRI scan reviewed Pathology report reviewed Clinical notes reviewed  HPI: Patient is a 61 year old male who presented with an elevated PSA of 6.9.  Prostate MRI was performed showing possible trans capsular spread with substantial capsular bulging.  There were 2 PI-RADS category 5 lesions in the right anterior transition zone and anterior fibromuscular stroma.  Patient underwent biopsy showing 2 of 13 cores positive for Gleason 6 (3+3).  He is asymptomatic specifically denies any increased lower urinary tract tract symptoms diarrhea or fatigue.  He is been told by urology he is not a surgical candidate and is seen today for radiation oncology opinion.  PLANNED TREATMENT REGIMEN: Surgery versus radiation  PAST MEDICAL HISTORY:  has a past medical history of History of kidney stones.    PAST SURGICAL HISTORY:  Past Surgical History:  Procedure Laterality Date   COLONOSCOPY  09/2021   lithotrypsi  2016   PROSTATE BIOPSY N/A 01/15/2022   Procedure: PROSTATE BIOPSY Vernelle Emerald;  Surgeon: Royston Cowper, MD;  Location: [facility] ORS;  Service: Urology;  Laterality: N/A;    FAMILY HISTORY: family history includes Cancer (age of onset: 46) in his father.  SOCIAL HISTORY:  reports that he has never smoked. He has never used smokeless tobacco. He reports that he does not currently use alcohol. He reports that he does not currently use drugs.  ALLERGIES: Patient has no known allergies.  MEDICATIONS:  No  current outpatient medications on file.   No current facility-administered medications for this encounter.    ECOG PERFORMANCE STATUS:  0 - Asymptomatic  REVIEW OF SYSTEMS: Patient denies any weight loss, fatigue, weakness, fever, chills or night sweats. Patient denies any loss of vision, blurred vision. Patient denies any ringing  of the ears or hearing loss. No irregular heartbeat. Patient denies heart murmur or history of fainting. Patient denies any chest pain or pain radiating to her upper extremities. Patient denies any shortness of breath, difficulty breathing at night, cough or hemoptysis. Patient denies any swelling in the lower legs. Patient denies any nausea vomiting, vomiting of blood, or coffee ground material in the vomitus. Patient denies any stomach pain. Patient states has had normal bowel movements no significant constipation or diarrhea. Patient denies any dysuria, hematuria or significant nocturia. Patient denies any problems walking, swelling in the joints or loss of balance. Patient denies any skin changes, loss of hair or loss of weight. Patient denies any excessive worrying or anxiety or significant depression. Patient denies any problems with insomnia. Patient denies excessive thirst, polyuria, polydipsia. Patient denies any swollen glands, patient denies easy bruising or easy bleeding. Patient denies any recent infections, allergies or URI. Patient "s visual fields have not changed significantly in recent time.   PHYSICAL EXAM: BP 119/74 (BP Location: Left Arm, Patient Position: Sitting, Cuff Size: Normal)   Pulse 75   Temp 98.7 F (37.1 C) (Tympanic)   Resp 16   Ht '5\' 11"'$  (1.803 m) Comment: stated HT  Wt 174 lb  8 oz (79.2 kg)   BMI 24.34 kg/m  Well-developed well-nourished patient in NAD. HEENT reveals PERLA, EOMI, discs not visualized.  Oral cavity is clear. No oral mucosal lesions are identified. Neck is clear without evidence of cervical or supraclavicular  adenopathy. Lungs are clear to A&P. Cardiac examination is essentially unremarkable with regular rate and rhythm without murmur rub or thrill. Abdomen is benign with no organomegaly or masses noted. Motor sensory and DTR levels are equal and symmetric in the upper and lower extremities. Cranial nerves II through XII are grossly intact. Proprioception is intact. No peripheral adenopathy or edema is identified. No motor or sensory levels are noted. Crude visual fields are within normal range.  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  Not sure he is not a surgical candidate based solely on the MRI findings.  Certainly a 61 year old male my first preference would be for robotic assisted prostatectomy.  Patient is seeking second opinion and once that opinion has been rendered we will make further recommendations as far as radiation is concerned.  I would like to take this opportunity to thank you for allowing me to participate in the care of your patient.Noreene Filbert, MD

## 2022-02-04 ENCOUNTER — Ambulatory Visit: Payer: BC Managed Care – PPO | Admitting: Urology

## 2022-02-04 ENCOUNTER — Encounter: Payer: Self-pay | Admitting: Urology

## 2022-02-04 VITALS — BP 147/86 | HR 75 | Ht 71.0 in | Wt 174.0 lb

## 2022-02-04 DIAGNOSIS — C61 Malignant neoplasm of prostate: Secondary | ICD-10-CM

## 2022-02-04 LAB — URINALYSIS, COMPLETE
Bilirubin, UA: NEGATIVE
Glucose, UA: NEGATIVE
Ketones, UA: NEGATIVE
Leukocytes,UA: NEGATIVE
Nitrite, UA: NEGATIVE
Protein,UA: NEGATIVE
Specific Gravity, UA: 1.025 (ref 1.005–1.030)
Urobilinogen, Ur: 0.2 mg/dL (ref 0.2–1.0)
pH, UA: 5 (ref 5.0–7.5)

## 2022-02-04 LAB — MICROSCOPIC EXAMINATION
Bacteria, UA: NONE SEEN
Epithelial Cells (non renal): NONE SEEN /hpf (ref 0–10)

## 2022-02-04 NOTE — Progress Notes (Signed)
02/04/2022 10:46 AM   Dillon Humphrey 27-Jul-1960 [id number]  Referring provider: Derinda Late, MD [phone] [address] [facility] [place],  [place] [address]    HPI: 61 year old male referred by Dr. Baruch Gouty for consideration of radical prostatectomy.  He was initially seen and evaluated by Dr. Yves Dill.  He had a prostate MRI which was suspicious and underwent UroNav.  Prostate MRI indicated 2 PI-RADS 5 lesions on the right anterior transition zone and anterior fibromuscular stroma concerning for possible trans capsular spread.  No lymphadenopathy or any other evidence of aggressive disease.  Prostate biopsy revealed -12 core biopsies however 3 of 5 cores positive for Gleason 3+3 up to 10% at region of interest #1 and 1 of 4 cores Gleason 3+3 in region of interest 2.  Prostate volume 36 cc based on MRI.  PSA 5.65.   IPSS     Row Name 02/04/22 1100         International Prostate Symptom Score   How often have you had the sensation of not emptying your bladder? Less than 1 in 5     How often have you had to urinate less than every two hours? Less than 1 in 5 times     How often have you found you stopped and started again several times when you urinated? Less than 1 in 5 times     How often have you found it difficult to postpone urination? Less than 1 in 5 times     How often have you had a weak urinary stream? More than half the time     How often have you had to strain to start urination? Not at All     How many times did you typically get up at night to urinate? None     Total IPSS Score 8       Quality of Life due to urinary symptoms   If you were to spend the rest of your life with your urinary condition just the way it is now how would you feel about that? Mostly Satisfied              Score:  1-7 Mild 8-19 Moderate 20-35 Severe   SHIM     Row Name 02/04/22 1135         SHIM: Over the last 6 months:   How do you rate  your confidence that you could get and keep an erection? Very Low     When you had erections with sexual stimulation, how often were your erections hard enough for penetration (entering your partner)? Almost Never or Never     During sexual intercourse, how often were you able to maintain your erection after you had penetrated (entered) your partner? Almost Never or Never     During sexual intercourse, how difficult was it to maintain your erection to completion of intercourse? Extremely Difficult     When you attempted sexual intercourse, how often was it satisfactory for you? A Few Times (much less than half the time)       SHIM Total Score   SHIM 6               PMH: Past Medical History:  Diagnosis Date   History of kidney stones     Surgical History: Past Surgical History:  Procedure Laterality Date   COLONOSCOPY  09/2021   lithotrypsi  2016   PROSTATE BIOPSY N/A 01/15/2022   Procedure:  PROSTATE BIOPSY URONAV;  Surgeon: Royston Cowper, MD;  Location: [facility] ORS;  Service: Urology;  Laterality: N/A;    Home Medications:  Allergies as of 02/04/2022   No Known Allergies      Medication List    as of February 04, 2022 11:59 PM   You have not been prescribed any medications.     Allergies: No Known Allergies  Family History: Family History  Problem Relation Age of Onset   Cancer Father 57       glioblasoma    Social History:  reports that he has never smoked. He has never used smokeless tobacco. He reports that he does not currently use alcohol. He reports that he does not currently use drugs.   Physical Exam: BP (!) 147/86   Pulse 75   Ht '5\' 11"'$  (1.803 m)   Wt 174 lb (78.9 kg)   BMI 24.27 kg/m   Constitutional:  Alert and oriented, No acute distress. HEENT: [place] AT, moist mucus membranes.  Trachea midline, no masses. Cardiovascular: No clubbing, cyanosis, or edema. Respiratory: Normal respiratory effort, no increased work of breathing. GI: Abdomen is  soft, nontender, nondistended, no abdominal masses GU: No CVA tenderness Skin: No rashes, bruises or suspicious lesions. Neurologic: Grossly intact, no focal deficits, moving all 4 extremities. Psychiatric: Normal mood and affect.   Pertinent Imaging: IMPRESSION: 1. Two PI-RADS category 5 lesions in the right anterior transition zone and anterior fibromuscular stroma. 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 Targeting data sent to [facility].  I personally reviewed the images from the above MRI.  There are some bulging of the capsule.  Unclear if there is evidence of TRUS capsular spread.  Assessment & Plan:    1. Prostate cancer [facility]) Newly diagnosed low risk prostate cancer-  The patient was counseled about the natural history of prostate cancer and the standard treatment options that are available for prostate cancer. It was explained to him how his age and life expectancy, clinical stage, Gleason score, and PSA affect his prognosis, the decision to proceed with additional staging studies, as well as how that information influences recommended treatment strategies. We discussed the roles for active surveillance, radiation therapy, surgical therapy, androgen deprivation, as well as ablative therapy options for the treatment of prostate cancer as appropriate to his individual cancer situation. We discussed the risks and benefits of these options with regard to their impact on cancer control and also in terms of potential adverse events, complications, and impact on quality of life particularly related to urinary, bowel, and sexual function. The patient was encouraged to ask questions throughout the discussion today and all questions were answered to his stated satisfaction. In addition, the patient was provided with and/or directed to appropriate resources and literature for further education about prostate cancer treatment options.  We discussed the NCCN guidelines as well as the below, technically falls  into the low risk category and active surveillance would be the strongest recommendation per NCCN and AUA guidelines.  That being said, based on his MRI, there is evidence of bulging of the capsule and even the possibility of transcapsular spread which is not in alignment with his pathology results are PSA and there is a clinical discrepancy here.  He also has a family history which is also somewhat worrisome.  We discussed various options including the below as well as cancer genetics to help possibly restratify or predict the degree of aggressiveness of the disease.  We discussed options including going ahead and proceeding with radiation,  surgery, and strongest recommendation at this point to repeat MRI/PSA in 6 months and consider repeat biopsy.  We discussed surgical therapy for prostate cancer including the different available surgical approaches.  Specifically, we discussed robotic prostatectomy without pelvic lymph node dissection based on his restratification.  We discussed, in detail, the risks and expectations of surgery with regard to cancer control, urinary control, and erectile dysfunction as well as expected post operative recovery processed. Additional risks of surgery including but not limited to bleeding, infection, hernia formation, nerve damage, fistula formation, bowel/rectal injury, potentially necessitating colostomy, damage to the urinary tract resulting in urinary leakage, urethral stricture, and cardiopulmonary risk such as myocardial infarction, stroke, death, thromboembolism etc. were explained.   We had a lengthy conversation today.  He like to go home and discuss this with his wife.  Given his family history and concerning MRI findings, he may just want to go ahead and proceed with surgery.  I will support any of his decisions as long as they are informed and is considered all the oncologic as well as quality of life outcome possibilities which were discussed  extensively.   Hollice Espy, MD  [facility] [address] [place], [place] [address] [phone]  I spent 60 total minutes on the day of the encounter including pre-visit review of the medical record, face-to-face time with the patient, and post visit ordering of labs/imaging/tests.  I reviewed images, labs, discussed case with Dr. Baruch Gouty and spent more than 40 minutes face-to-face with the patient in addition to charting time and chart review.

## 2022-02-06 ENCOUNTER — Encounter: Payer: Self-pay | Admitting: Urology

## 2022-02-10 ENCOUNTER — Other Ambulatory Visit: Payer: Self-pay | Admitting: Urology

## 2022-02-10 DIAGNOSIS — C61 Malignant neoplasm of prostate: Secondary | ICD-10-CM

## 2022-02-10 NOTE — Progress Notes (Signed)
Surgical Physician Order Form [facility] [place]  * Scheduling expectation : Next Available  *Length of Case:   *Clearance needed: no  *Anticoagulation Instructions: Hold all anticoagulants  *Aspirin Instructions: Hold Aspirin  *Post-op visit Date/Instructions:  1 week cath removal  *Diagnosis: Prostate Cancer  *Procedure:  Robotic laparoscopic Prostatectomy (71245) (no LN dissection)   Additional orders: N/A  -Admit type: Observation  -Anesthesia: General  -VTE Prophylaxis Standing Order SCD's       Other:   -Standing Lab Orders Per Anesthesia    Lab other:  CBC, BMP, UA/urine culture, INR, type and screen  -Standing Test orders EKG/Chest x-ray per Anesthesia       Test other:   - Medications:  Ancef 2gm IV  -Other orders:   Clears the day before surgery  Please see my chart message

## 2022-02-11 ENCOUNTER — Encounter: Payer: Self-pay | Admitting: Urology

## 2022-02-17 ENCOUNTER — Telehealth: Payer: Self-pay

## 2022-02-17 NOTE — Addendum Note (Signed)
Addended by: Gerald Leitz A on: 02/17/2022 02:28 PM   Modules accepted: Orders

## 2022-02-17 NOTE — Telephone Encounter (Signed)
I spoke with Dillon Humphrey and his spouse. We have discussed possible surgery dates and Monday November 6th, 2023 was agreed upon by all parties. Patient given information about surgery date, what to expect pre-operatively and post operatively.  We discussed that a Pre-Admission Testing office will be calling to set up the pre-op visit that will take place prior to surgery, and that these appointments are typically done over the phone with a Pre-Admissions RN.   Informed patient that our office will communicate any additional care to be provided after surgery. Patients questions or concerns were discussed during our call. Advised to call our office should there be any additional information, questions or concerns that arise. Patient verbalized understanding.

## 2022-02-17 NOTE — Progress Notes (Signed)
[facility] Posting Form   Surgery Date/Time: Date: 04/06/2022  Surgeon: Dr. Hollice Espy, MD  Surgery Location: Day Surgery  Inpt ( No  )   Outpt (No)   Obs ( Yes  )   Diagnosis: C61 Prostate Cancer  -CPT: 77939  Surgery: Robotic Laparoscopic Radical Prostatectomy   Stop Anticoagulations: Yes and hold ASA  Cardiac/Medical/Pulmonary Clearance needed: no  *Orders entered into EPIC  Date: 02/17/22   *Case booked in EPIC  Date: 02/16/2022  *Notified pt of Surgery: Date: 02/16/2022  PRE-OP UA & CX: yes and also CBC, BMP, UA and Cx, Type and Screen, INR   *Placed into Prior Authorization Work [place] Date: 02/17/22   Assistant/laser/rep:yes, Dr. Diamantina Providence to assist

## 2022-03-06 ENCOUNTER — Encounter: Payer: Self-pay | Admitting: Physical Therapy

## 2022-03-06 ENCOUNTER — Ambulatory Visit: Payer: BC Managed Care – PPO | Attending: Urology | Admitting: Physical Therapy

## 2022-03-06 DIAGNOSIS — C61 Malignant neoplasm of prostate: Secondary | ICD-10-CM | POA: Diagnosis not present

## 2022-03-06 DIAGNOSIS — R278 Other lack of coordination: Secondary | ICD-10-CM

## 2022-03-06 DIAGNOSIS — M533 Sacrococcygeal disorders, not elsewhere classified: Secondary | ICD-10-CM | POA: Diagnosis not present

## 2022-03-06 NOTE — Patient Instructions (Signed)
Avoid straining pelvic floor, abdominal muscles , spine  Use log rolling technique instead of getting out of bed with your neck or the sit-up     Log rolling into and out of bed   Log rolling into and out of bed If getting out of bed on R side, Bent knees, scoot hips/ shoulder to L  Raise R arm completely overhead, rolling onto armpit  Then lower bent knees to bed to get into complete side lying position  Then drop legs off bed, and push up onto R elbow/forearm, and use L hand to push onto the bed  __   Proper body mechanics with getting out of a chair to decrease strain  on back &pelvic floor   Avoid holding your breath when Getting out of the chair:  Scoot to front part of chair chair Heels behind knees, feet are hip width apart, nose over toes  Inhale like you are smelling roses Exhale to stand   __  Sitting with feet on ground, four points of contact Catch yourself crossing ankles and thighs  __  

## 2022-03-06 NOTE — Therapy (Signed)
OUTPATIENT PHYSICAL THERAPY EVALUATION   Patient Name: Dillon Humphrey MRN: [id number] DOB:[birth year], 61 y.o., male Today's Date: 03/06/2022   PT End of Session - 03/06/22 0805     Visit Number 1    Number of Visits 10    Date for PT Re-Evaluation 05/15/22    PT Start Time 0803    PT Stop Time 0845    PT Time Calculation (min) 42 min    Activity Tolerance Patient tolerated treatment well    Behavior During Therapy [facility] for tasks assessed/performed             Past Medical History:  Diagnosis Date   History of kidney stones    Past Surgical History:  Procedure Laterality Date   COLONOSCOPY  09/2021   lithotrypsi  2016   PROSTATE BIOPSY N/A 01/15/2022   Procedure: PROSTATE BIOPSY Vernelle Emerald;  Surgeon: Royston Cowper, MD;  Location: [facility] ORS;  Service: Urology;  Laterality: N/A;   There are no problems to display for this patient.   PCP: Baldemar Lenis  REFERRING PROVIDER:  Lemar Livings DIAG: Prostate Cancer   Rationale for Evaluation and Treatment Rehabilitation  THERAPY DIAG:  Sacrococcygeal disorders, not elsewhere classified  Other lack of coordination  ONSET DATE:   SUBJECTIVE:                                                                                                                                                                                           SUBJECTIVE STATEMENT:  Pt is scheduled for prostate surgery 04/16/22            1) Slowed urine stream and has to urinate several times to get it all out : especially when he first get up morning  2) stiffness of shoulders, neck, and low back:   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     Stiffness occurs mostly upon waking. Sleeps on his back. Started within the last 1-2 months.   3) dull aches in his legs constant "like through to the bone" :  His legs ache at the end of the day when he stands all day.  Pt has had his circulation checked and it seems to be fine. 3/10.  No radiating pain. 10-15 years. Denied injuries to tailbone, falls.   PERTINENT HISTORY:  Family Hx of prostate cancer , Fx of ball of his R foot near big toes 10 years ago. Pt wears an inserts.   Used to do push ups and squats.  Plays disc golf once a week with kids.   PAIN:  Are  you having pain?No  PRECAUTIONS: None  WEIGHT BEARING RESTRICTIONS No  FALLS:  Has patient fallen in last 6 months? No  LIVING ENVIRONMENT: Lives with: lives with their spouse Lives in: House/apartment Stairs: Yes: External: 4 steps; none  OCCUPATION: Nurse, learning disability  PLOF: Independent  PATIENT GOALS  Control bladder after surgery   OBJECTIVE:    [facility] PT Assessment - 03/06/22 0827       AROM   Overall AROM Comments LBP triggered with R rotation and sideflexion      Strength   Overall Strength Comments R hip flex 3/5,  B  knee ext/ flex 5/5      Palpation   SI assessment  L iliac crest, R shoulder higher, convex curve at L lumbar, R thoracic convex      Ambulation/Gait   Gait Comments decreased stance on R, limited L shoulder posterior rotation, 1.29 m/s                [facility] Adult PT Treatment/Exercise - 03/06/22 0827       Therapeutic Activites    Therapeutic Activities Other Therapeutic Activities    Other Therapeutic Activities see pt education section      Neuro Re-ed    Neuro Re-ed Details  cued for body mechanics to minimize straining pelvic floor              HOME EXERCISE PROGRAM:    HOME EXERCISE PROGRAM: See pt instruction section    ASSESSMENT:  CLINICAL IMPRESSION: Pt is a  61    yo male who is scheduled for prostatectomy on 04/06/22. Pt was referred to Pelvic [facility] PT to train his pelvic floor mm to yield better outcomes with continence post-surgery. Pt 's clinical presentations included signs of poor intraabdominal pressure system which is associated increased risk for urinary incontinence: _dyscoordination of deep core mm _lack of  understanding on exercises / body mechanics to ADL/ work tasks that place less strain on the abdomen/pelvic floor _gait deficits, limited spinal ROM _spinal and pelvic misalignment  ( L iliac crest / R shoulder higher)   Pt was provided education on etiology of Sx with anatomy, physiology explanation with images along with the benefits of customized pelvic PT Tx based on pt's medical conditions and musculoskeletal deficits.   Following Tx, pt demo'd proper body mechanics with sitting, sit-to-stand, and getting in/out of bed to minimize straining pelvic floor, abdominals, and spine. Plan to address pelvic and spinal   alignment.   Pt benefits from skilled PT.    OBJECTIVE IMPAIRMENTS decreased activity tolerance, decreased coordination, decreased endurance, decreased mobility, difficulty walking, decreased ROM, decreased strength, decreased safety awareness, hypomobility, increased muscle spasms, impaired flexibility, improper body mechanics, postural dysfunction, and pain.    ACTIVITY LIMITATIONS  self-care,  home chores, work tasks    PARTICIPATION LIMITATIONS:  community, gym activities    [place]     are also affecting patient's functional outcome.    REHAB POTENTIAL: Good   CLINICAL DECISION MAKING: Evolving/moderate complexity   EVALUATION COMPLEXITY: Moderate    PATIENT EDUCATION:    Education details: Showed pt anatomy images. Explained muscles attachments/ connection, physiology of deep core system/ spinal- thoracic-pelvis-lower kinetic chain as they relate to pt's presentation, Sx, and past Hx. Explained what and how these areas of deficits need to be restored to balance and function    See Therapeutic activity / neuromuscular re-education section  Answered pt's questions.   Person educated: Patient Education method: Explanation, Demonstration, Tactile cues,  Verbal cues, and Handouts Education comprehension: verbalized understanding, returned demonstration, verbal  cues required, tactile cues required, and needs further education     PLAN: PT FREQUENCY: 1x/week   PT DURATION: 4 weeks   PLANNED INTERVENTIONS: Therapeutic exercises, Therapeutic activity, Neuromuscular re-education, Balance training, Gait training, Patient/Family education, Self Care, Joint mobilization, Spinal mobilization, Moist heat, Taping, and Manual therapy.   PLAN FOR NEXT SESSION: See clinical impression for plan     GOALS: Goals reviewed with patient? Yes  SHORT TERM GOALS: Target date: 03/20/2022   Pt will demo IND with HEP                    Baseline: Not IND            Goal status: INITIAL   LONG TERM GOALS: Target date: 04/03/2022    1.Pt will demo proper deep core coordination without chest breathing and optimal excursion of diaphragm/pelvic floor in order to promote spinal stability and pelvic floor function  Baseline: dyscoordination Goal status: INITIAL  2.  Pt will demo > 5 pt change on FOTO  to improve QOL and function   Lumber baseline  - 94 pts  Goal status: INITIAL  3.  Pt will demo proper body mechanics in against gravity tasks and ADLs  work tasks, fitness  to minimize straining pelvic floor / back                  Baseline: not IND, improper form that places strain on pelvic floor                Goal status: INITIAL    4. Pt will demo levelled pelvic girdle and shoulder height in order to progress to deep core strengthening HEP and restore mobility at spine, pelvis, gait, posture   Baseline:  L iliac, R shoulder higher Goal status: INITIAL    5. Pt will demo proper pelvic floor coordination to advance to kegel program to minimize incontinence Baseline:  no knowledge of kegels  Goal status: INITIAL     Jerl Mina, PT 03/06/2022, 8:28 AM

## 2022-03-12 ENCOUNTER — Ambulatory Visit: Payer: BC Managed Care – PPO | Admitting: Physical Therapy

## 2022-03-12 DIAGNOSIS — M533 Sacrococcygeal disorders, not elsewhere classified: Secondary | ICD-10-CM

## 2022-03-12 DIAGNOSIS — R278 Other lack of coordination: Secondary | ICD-10-CM

## 2022-03-12 NOTE — Therapy (Signed)
OUTPATIENT PHYSICAL THERAPY Treatment    Patient Name: Dillon Humphrey MRN: [id number] DOB:14-May-1961, 61 y.o., male Today's Date: 03/13/2022   PT End of Session - 03/12/22 0807     Visit Number 2    Number of Visits 10    Date for PT Re-Evaluation 05/15/22    PT Start Time 0803    PT Stop Time 0845    PT Time Calculation (min) 42 min    Activity Tolerance Patient tolerated treatment well    Behavior During Therapy [facility] for tasks assessed/performed             Past Medical History:  Diagnosis Date   History of kidney stones    Past Surgical History:  Procedure Laterality Date   COLONOSCOPY  09/2021   lithotrypsi  2016   PROSTATE BIOPSY N/A 01/15/2022   Procedure: PROSTATE BIOPSY Vernelle Emerald;  Surgeon: Royston Cowper, MD;  Location: [facility] ORS;  Service: Urology;  Laterality: N/A;   There are no problems to display for this patient.   PCP: Baldemar Lenis  REFERRING PROVIDER:  Lemar Livings DIAG: Prostate Cancer   Rationale for Evaluation and Treatment Rehabilitation  THERAPY DIAG:  Sacrococcygeal disorders, not elsewhere classified  Other lack of coordination  ONSET DATE:   SUBJECTIVE:                   SUBJECTIVE STATEMENT on  TODAY 03/12/22:                         Pt practiced the body mechanics he learned last week. Pt had no questions from evaluation.                                                                                                                                                                                        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     Stiffness occurs mostly upon waking. Sleeps on his back. Started within the last 1-2 months.    3) dull aches in his legs constant "like through to the bone" :  His legs ache at the end of the day when he stands all day.  Pt has had his circulation checked and it seems to  be fine. 3/10. No radiating pain. 10-15 years. Denied injuries to tailbone, falls.   PERTINENT HISTORY:  Family Hx of prostate cancer , Fx of ball of his R foot near big toes 10 years ago. Pt wears an inserts.   Used to do push ups and squats.  Plays disc golf once a week with kids.   PAIN:  Are you having pain?No  PRECAUTIONS: None  WEIGHT BEARING RESTRICTIONS No  FALLS:  Has patient fallen in last 6 months? No  LIVING ENVIRONMENT: Lives with: lives with their spouse Lives in: House/apartment Stairs: Yes: External: 4 steps; none  OCCUPATION: Nurse, learning disability  PLOF: Independent  PATIENT GOALS  Control bladder after surgery   OBJECTIVE:    [facility] PT Assessment - 03/12/22 0809       Coordination   Coordination and Movement Description limited excursion of diaphragm      Strength   Overall Strength Comments PF MMT 4 reps R, 1 rep L without UE support,  B DF/EV 4/5, toe flexion/ ext R 3/5, L 4/5      Palpation   Spinal mobility tightness and convex L lumbar curve, L paraspinal ,    SI assessment  levelled pelvic girdle              [facility] Adult PT Treatment/Exercise - 03/12/22 0809       Therapeutic Activites    Other Therapeutic Activities explained regional interdependent approach with past Hx of R toe Fx impacting BLE weakness which needs to be addressed first to optimize pelvic function,      Neuro Re-ed    Neuro Re-ed Details  cued for SLS heel raises with UE support,      Manual Therapy   Manual therapy comments STM/MWM at L paraspinals, teres minor/ major, interspinals/ paraspinals to promote optimize diaphragm excursion               HOME EXERCISE PROGRAM:    HOME EXERCISE PROGRAM: See pt instruction section    ASSESSMENT:  CLINICAL IMPRESSION: Pt's pelvic  alignment was levelled today but spinal misalignment remain present. Manual Tx was applied today to address L convex lumbar curve. Anticipate pt will be ready to progress to deep core coordination at next session.  Added PF strengthening as pt demo'd significantly weakness with Hx of R big toe Fx many years ago. Plan to further address lower kinetic chain deficits to yield better outcomes for pelvic function and continence in preparation for RALP.  Pt benefits from skilled PT.    OBJECTIVE IMPAIRMENTS decreased activity tolerance, decreased coordination, decreased endurance, decreased mobility, difficulty walking, decreased ROM, decreased strength, decreased safety awareness, hypomobility, increased muscle spasms, impaired flexibility, improper body mechanics, postural dysfunction, and pain.    ACTIVITY LIMITATIONS  self-care,  home chores, work tasks    PARTICIPATION LIMITATIONS:  community, gym activities    [place]     are also affecting patient's functional outcome.    REHAB POTENTIAL: Good   CLINICAL DECISION MAKING: Evolving/moderate complexity   EVALUATION COMPLEXITY: Moderate    PATIENT EDUCATION:    Education details: Showed pt anatomy images. Explained muscles attachments/ connection, physiology of deep core system/ spinal- thoracic-pelvis-lower kinetic chain as they relate to pt's presentation, Sx, and past Hx. Explained what and how these areas of deficits need to be restored to balance and function    See Therapeutic activity / neuromuscular re-education section  Answered pt's questions.   Person educated: Patient Education method: Explanation, Demonstration, Tactile cues,  Verbal cues, and Handouts Education comprehension: verbalized understanding, returned demonstration, verbal cues required, tactile cues required, and needs further education     PLAN: PT FREQUENCY: 1x/week   PT DURATION: 4 weeks   PLANNED INTERVENTIONS: Therapeutic exercises, Therapeutic  activity, Neuromuscular re-education, Balance training, Gait training, Patient/Family education, Self Care, Joint mobilization, Spinal mobilization, Moist heat, Taping, and Manual therapy.   PLAN FOR NEXT SESSION: See clinical impression for plan     GOALS: Goals reviewed with patient? Yes  SHORT TERM GOALS: Target date: 03/20/2022   Pt will demo IND with HEP                    Baseline: Not IND            Goal status: INITIAL   LONG TERM GOALS: Target date: 04/03/2022    1.Pt will demo proper deep core coordination without chest breathing and optimal excursion of diaphragm/pelvic floor in order to promote spinal stability and pelvic floor function  Baseline: dyscoordination Goal status: INITIAL  2.  Pt will demo > 5 pt change on FOTO  to improve QOL and function   Lumber baseline  - 94 pts  Goal status: INITIAL  3.  Pt will demo proper body mechanics in against gravity tasks and ADLs  work tasks, fitness  to minimize straining pelvic floor / back                  Baseline: not IND, improper form that places strain on pelvic floor                Goal status: INITIAL    4. Pt will demo levelled pelvic girdle and shoulder height in order to progress to deep core strengthening HEP and restore mobility at spine, pelvis, gait, posture   Baseline:  L iliac, R shoulder higher Goal status: INITIAL    5. Pt will demo proper pelvic floor coordination to advance to kegel program to minimize incontinence Baseline:  no knowledge of kegels  Goal status: INITIAL     Jerl Mina, PT 03/13/2022, 8:09 AM

## 2022-03-12 NOTE — Patient Instructions (Addendum)
Morning/ evening  Lengthen Back rib by L  shoulder ( winging)    Lie on R  side , pillow between knees and under head  Pull  L arm overhead over mattress, grab the edge of mattress,pull it upward, drawing elbow away from ears  Breathing 10 reps  Brushing arm with 3/4 turn onto pillow behind back  Lying on R  side ,Pillow/ Block between knees     dragging top forearm across ribs below breast rotating 3/4 turn,  rotating  _L_ only this week ,  relax onto the pillow behind the back  and then back to other palm , maintain top palm on body whole top and not lift shoulder  ___  Single heel raises with hand on wall,  10 reps, slightly bent knees, more center of mass over ballmounds, not rocking to heel when you lower heel 3 x day

## 2022-03-19 ENCOUNTER — Ambulatory Visit: Payer: BC Managed Care – PPO | Admitting: Physical Therapy

## 2022-03-19 DIAGNOSIS — M533 Sacrococcygeal disorders, not elsewhere classified: Secondary | ICD-10-CM | POA: Diagnosis not present

## 2022-03-19 DIAGNOSIS — R278 Other lack of coordination: Secondary | ICD-10-CM

## 2022-03-19 NOTE — Therapy (Signed)
OUTPATIENT PHYSICAL THERAPY Treatment    Patient Name: Dillon Humphrey MRN: [id number] DOB:Oct 24, 1960, 61 y.o., male Today's Date: 03/19/2022   PT End of Session - 03/19/22 0809     Visit Number 3    Number of Visits 10    Date for PT Re-Evaluation 05/15/22    PT Start Time 0803    PT Stop Time 3790    PT Time Calculation (min) 43 min    Activity Tolerance Patient tolerated treatment well    Behavior During Therapy [facility] for tasks assessed/performed             Past Medical History:  Diagnosis Date   History of kidney stones    Past Surgical History:  Procedure Laterality Date   COLONOSCOPY  09/2021   lithotrypsi  2016   PROSTATE BIOPSY N/A 01/15/2022   Procedure: PROSTATE BIOPSY Vernelle Emerald;  Surgeon: Royston Cowper, MD;  Location: [facility] ORS;  Service: Urology;  Laterality: N/A;   There are no problems to display for this patient.   PCP: Baldemar Lenis  REFERRING PROVIDER:  Lemar Livings DIAG: Prostate Cancer   Rationale for Evaluation and Treatment Rehabilitation  THERAPY DIAG:  Sacrococcygeal disorders, not elsewhere classified  Other lack of coordination  ONSET DATE:   SUBJECTIVE:                   SUBJECTIVE STATEMENT on  TODAY 03/19/22:                         Pt had a question about the angel wing exercise                                                                                                                                                                                        SUBJECTIVE STATEMENT on EVAL 03/06/22:  Pt is scheduled for prostate surgery 04/16/22            1) Slowed urine stream and has to urinate several times to get it all out : especially when he first get up morning  2) stiffness of shoulders, neck, and low back:   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     Stiffness occurs mostly upon waking. Sleeps on his back. Started within the last 1-2 months.   3) dull aches in his legs constant "like  through to the bone" :  His legs ache at the end of the day when he stands all day.  Pt has had his circulation checked and it seems to be fine. 3/10. No radiating pain.  10-15 years. Denied injuries to tailbone, falls.   PERTINENT HISTORY:  Family Hx of prostate cancer , Fx of ball of his R foot near big toes 10 years ago. Pt wears an inserts.   Used to do push ups and squats.  Plays disc golf once a week with kids.   PAIN:  Are you having pain?No  PRECAUTIONS: None  WEIGHT BEARING RESTRICTIONS No  FALLS:  Has patient fallen in last 6 months? No  LIVING ENVIRONMENT: Lives with: lives with their spouse Lives in: House/apartment Stairs: Yes: External: 4 steps; none  OCCUPATION: Nurse, learning disability  PLOF: Independent  PATIENT GOALS  Control bladder after surgery   OBJECTIVE:    [facility] PT Assessment - 03/19/22 0810       Observation/Other Assessments   Observations reviewed his workstation ( observed monitor to the R, and hands higher than elbow.      Palpation   Spinal mobility R convex curve at T3-5,  R shoulder slightly higher,  pelvis levelled , no more lumbar convex curve    Palpation comment tightness along R intercostals, interspinals, R , suprascapular mm attachments, occiput R             [facility] Adult PT Treatment/Exercise - 03/19/22 0926       Therapeutic Activites    Other Therapeutic Activities reviewed his work station, provided ergonomic modifications to minimize spinal deviations      Neuro Re-ed    Neuro Re-ed Details  cued for angel wing HEP for correct form      Modalities   Modalities Moist Heat      Moist Heat Therapy   Number Minutes Moist Heat 10 Minutes    Moist Heat Location --   thoracic/ cervical, ( unbilled), legs propped     Manual Therapy   Manual therapy comments STM/MWM at R  interspinals/ paraspinals to promote optimize diaphragm excursion and lower R shoulder               HOME EXERCISE PROGRAM:    HOME EXERCISE  PROGRAM: See pt instruction section    ASSESSMENT:  CLINICAL IMPRESSION: Pt's pelvic alignment was levelled and lumbar spinal misalignment has been corrected but C/T junction remain present.  Pt required manual Tx to minimize R shoulder/ cervical area tightness and convex curve. Discussed modifications to his workstation to minimize cervical R rotation by moving monitor to central alignment and not repeatedly turning R head.  Pt's improving structrual alignment will  to yield better outcomes for pelvic function and continence in preparation for RALP.  Plan to advance to deep core level 1-2 next session.  Pt benefits from skilled PT.    OBJECTIVE IMPAIRMENTS decreased activity tolerance, decreased coordination, decreased endurance, decreased mobility, difficulty walking, decreased ROM, decreased strength, decreased safety awareness, hypomobility, increased muscle spasms, impaired flexibility, improper body mechanics, postural dysfunction, and pain.    ACTIVITY LIMITATIONS  self-care,  home chores, work tasks    PARTICIPATION LIMITATIONS:  community, gym activities    [place]     are also affecting patient's functional outcome.    REHAB POTENTIAL: Good   CLINICAL DECISION MAKING: Evolving/moderate complexity   EVALUATION COMPLEXITY: Moderate    PATIENT EDUCATION:    Education details: Showed pt anatomy images. Explained muscles attachments/ connection, physiology of deep core system/ spinal- thoracic-pelvis-lower kinetic chain as they relate to pt's presentation, Sx, and past Hx. Explained what and how these areas of deficits need to be restored to balance and function  See Therapeutic activity / neuromuscular re-education section  Answered pt's questions.   Person educated: Patient Education method: Explanation, Demonstration, Tactile cues, Verbal cues, and Handouts Education comprehension: verbalized understanding, returned demonstration, verbal cues required, tactile  cues required, and needs further education     PLAN: PT FREQUENCY: 1x/week   PT DURATION: 4 weeks   PLANNED INTERVENTIONS: Therapeutic exercises, Therapeutic activity, Neuromuscular re-education, Balance training, Gait training, Patient/Family education, Self Care, Joint mobilization, Spinal mobilization, Moist heat, Taping, and Manual therapy.   PLAN FOR NEXT SESSION: See clinical impression for plan     GOALS: Goals reviewed with patient? Yes  SHORT TERM GOALS: Target date: 03/20/2022   Pt will demo IND with HEP                    Baseline: Not IND            Goal status: INITIAL   LONG TERM GOALS: Target date: 04/03/2022    1.Pt will demo proper deep core coordination without chest breathing and optimal excursion of diaphragm/pelvic floor in order to promote spinal stability and pelvic floor function  Baseline: dyscoordination Goal status: INITIAL  2.  Pt will demo > 5 pt change on FOTO  to improve QOL and function   Lumber baseline  - 94 pts  Goal status: INITIAL  3.  Pt will demo proper body mechanics in against gravity tasks and ADLs  work tasks, fitness  to minimize straining pelvic floor / back                  Baseline: not IND, improper form that places strain on pelvic floor                Goal status: INITIAL    4. Pt will demo levelled pelvic girdle and shoulder height in order to progress to deep core strengthening HEP and restore mobility at spine, pelvis, gait, posture   Baseline:  L iliac, R shoulder higher Goal status: INITIAL    5. Pt will demo proper pelvic floor coordination to advance to kegel program to minimize incontinence Baseline:  no knowledge of kegels  Goal status: INITIAL     Jerl Mina, PT 03/19/2022, 9:29 AM

## 2022-03-26 ENCOUNTER — Ambulatory Visit: Payer: BC Managed Care – PPO | Admitting: Physical Therapy

## 2022-03-26 DIAGNOSIS — R278 Other lack of coordination: Secondary | ICD-10-CM

## 2022-03-26 DIAGNOSIS — M533 Sacrococcygeal disorders, not elsewhere classified: Secondary | ICD-10-CM

## 2022-03-26 NOTE — Patient Instructions (Addendum)
  ____  Neck / shoulder stretches:    Lying on back - small sushi roll towel under neck  _ 6 directions  of n eck   ___  Multifidis twist   Band is on doorknob: sit facing perpendicular to door , sit halfway towards front of chair, firm through 4 points of contact at buttocks and feet. Feet are placed hip with apart.   Twisting trunk without moving the hips and knees Hold band at the level of ribcage, elbows bent,shoulder blades roll back and down like squeezing a pencil under armpit   Exhale twist,.10-15 deg away from door without moving your hips/ knees, press more weight on the side of the sitting bones/ foot opp of your direction of turn as your counterweight. Continue to maintain equal weight through legs.  Keep knee unlocked.  30 reps    ___  Deep core level 1 ( handout)   __   standing posture at work with more pressure through 4 points of feet, shoulders/ cervical down and back

## 2022-03-26 NOTE — Therapy (Signed)
OUTPATIENT PHYSICAL THERAPY Treatment    Patient Name: LAUREN AGUAYO MRN: [id number] DOB:1961/05/06, 61 y.o., male Today's Date: 03/26/2022   PT End of Session - 03/26/22 0806     Visit Number 4    Number of Visits 10    Date for PT Re-Evaluation 05/15/22    PT Start Time 0804    PT Stop Time 0845    PT Time Calculation (min) 41 min    Activity Tolerance Patient tolerated treatment well    Behavior During Therapy [facility] for tasks assessed/performed             Past Medical History:  Diagnosis Date   History of kidney stones    Past Surgical History:  Procedure Laterality Date   COLONOSCOPY  09/2021   lithotrypsi  2016   PROSTATE BIOPSY N/A 01/15/2022   Procedure: PROSTATE BIOPSY Vernelle Emerald;  Surgeon: Royston Cowper, MD;  Location: [facility] ORS;  Service: Urology;  Laterality: N/A;   There are no problems to display for this patient.   PCP: Baldemar Lenis  REFERRING PROVIDER:  Lemar Livings DIAG: Prostate Cancer   Rationale for Evaluation and Treatment Rehabilitation  THERAPY DIAG:  Sacrococcygeal disorders, not elsewhere classified  Other lack of coordination  ONSET DATE:   SUBJECTIVE:                   SUBJECTIVE STATEMENT on  TODAY 03/26/22:                         Pt noticed his L neck mm pulls towards the shoulder. The shoulders feel better. Pt has moved his computer monitor to be more centered so he is no turning his neck to the R as much. Starting this week, he noticed R low rib started hurting when standing at the [place]. By the end of the day, he noticed it hurt across the waist. This morning, it is not bothering him but towards the end of the day, it bothers him.                                                                                                                                                                                        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     Stiffness occurs mostly upon waking. Sleeps on his back. Started within the last 1-2 months.   3) dull aches in his legs constant "like through to the bone" :  His legs ache at the end of the day when he stands all day.  Pt has had his circulation checked and it seems to be fine. 3/10. No radiating pain. 10-15 years. Denied injuries to tailbone, falls.   PERTINENT HISTORY:  Family Hx of prostate cancer , Fx of ball of his R foot near big toes 10 years ago. Pt wears an inserts.   Used to do push ups and squats.  Plays disc golf once a week with kids.   PAIN:  Are you having pain?No  PRECAUTIONS: None  WEIGHT BEARING RESTRICTIONS No  FALLS:  Has patient fallen in last 6 months? No  LIVING ENVIRONMENT: Lives with: lives with their spouse Lives in: House/apartment Stairs: Yes: External: 4 steps; none  OCCUPATION: Nurse, learning disability  PLOF: Independent  PATIENT GOALS  Control bladder after surgery   OBJECTIVE:     [facility] PT Assessment - 03/26/22 0821       Posture/Postural Control   Posture Comments standing with posterior COM, increased lumbar lordosis, forward head posture      Palpation   Spinal mobility anterior rib lower limited excursion    Palpation comment hypomobile C7, interspinals/ intercostals T1-T5 B             [facility] Adult PT Treatment/Exercise - 03/26/22 0818       Therapeutic Activites    Other Therapeutic Activities discussed differential diagnosis for LBP versus kidney stone referral pain and when to contact medical provider if pain persists regardless of position changes/ MSK. Applied postural awareness at standing posture at work when at [employer] Re-ed    Neuro Re-ed Details  cued for standing posture at work to  promote cervical/ scapular retraction/depression,  seated multidifis, deep core level 1 , ROM for cervical mobility      Modalities   Modalities Moist Heat      Moist Heat Therapy   Number Minutes Moist Heat 4 Minutes    Moist Heat Location --   C/T junction, ( unbilled)     Manual Therapy   Manual therapy comments STM/MWM at problem areas noted in assessment to promote realignment of C/T junction and optimize R anterior lateral ribs for diaphramgatic excursion               HOME EXERCISE PROGRAM:    HOME EXERCISE PROGRAM: See pt instruction section    ASSESSMENT:  CLINICAL IMPRESSION: Further addressed the hypomobility at C/T junction today. Pt demo'd improved anterolateral excursion at diaphragm after Tx and was progressed to deep core level 1 training. Pt also was provided multidifis strengthening, cervical ROM, and cervical scapular retraction training today which pt required cues for technique.    Discussed differential diagnosis for LBP versus kidney stone referral pain and when to contact medical provider if pain persists regardless of position changes/ MSK. Applied postural awareness training at standing posture at work when at [employer] as a Nurse, learning disability.    Pt's improving structrual alignment will help  to yield better outcomes for pelvic function and continence in preparation for RALP.  Plan to advance to deep core level 2 next session.  Pt benefits from skilled PT.    OBJECTIVE IMPAIRMENTS decreased activity tolerance, decreased coordination, decreased endurance,  decreased mobility, difficulty walking, decreased ROM, decreased strength, decreased safety awareness, hypomobility, increased muscle spasms, impaired flexibility, improper body mechanics, postural dysfunction, and pain.    ACTIVITY LIMITATIONS  self-care,  home chores, work tasks    PARTICIPATION LIMITATIONS:  community, gym activities    [place]     are also affecting patient's  functional outcome.    REHAB POTENTIAL: Good   CLINICAL DECISION MAKING: Evolving/moderate complexity   EVALUATION COMPLEXITY: Moderate    PATIENT EDUCATION:    Education details: Showed pt anatomy images. Explained muscles attachments/ connection, physiology of deep core system/ spinal- thoracic-pelvis-lower kinetic chain as they relate to pt's presentation, Sx, and past Hx. Explained what and how these areas of deficits need to be restored to balance and function    See Therapeutic activity / neuromuscular re-education section  Answered pt's questions.   Person educated: Patient Education method: Explanation, Demonstration, Tactile cues, Verbal cues, and Handouts Education comprehension: verbalized understanding, returned demonstration, verbal cues required, tactile cues required, and needs further education     PLAN: PT FREQUENCY: 1x/week   PT DURATION: 4 weeks   PLANNED INTERVENTIONS: Therapeutic exercises, Therapeutic activity, Neuromuscular re-education, Balance training, Gait training, Patient/Family education, Self Care, Joint mobilization, Spinal mobilization, Moist heat, Taping, and Manual therapy.   PLAN FOR NEXT SESSION: See clinical impression for plan     GOALS: Goals reviewed with patient? Yes  SHORT TERM GOALS: Target date: 03/20/2022   Pt will demo IND with HEP                    Baseline: Not IND            Goal status: INITIAL   LONG TERM GOALS: Target date: 04/03/2022    1.Pt will demo proper deep core coordination without chest breathing and optimal excursion of diaphragm/pelvic floor in order to promote spinal stability and pelvic floor function  Baseline: dyscoordination Goal status: INITIAL  2.  Pt will demo > 5 pt change on FOTO  to improve QOL and function   Lumber baseline  - 94 pts  Goal status: INITIAL  3.  Pt will demo proper body mechanics in against gravity tasks and ADLs  work tasks, fitness  to minimize straining pelvic floor  / back                  Baseline: not IND, improper form that places strain on pelvic floor                Goal status: INITIAL    4. Pt will demo levelled pelvic girdle and shoulder height in order to progress to deep core strengthening HEP and restore mobility at spine, pelvis, gait, posture   Baseline:  L iliac, R shoulder higher Goal status: INITIAL    5. Pt will demo proper pelvic floor coordination to advance to kegel program to minimize incontinence Baseline:  no knowledge of kegels  Goal status: INITIAL     Jerl Mina, PT 03/26/2022, 8:06 AM

## 2022-03-27 ENCOUNTER — Encounter
Admission: RE | Admit: 2022-03-27 | Discharge: 2022-03-27 | Disposition: A | Discharge status: Home / Self Care | Payer: BC Managed Care – PPO | Source: Ambulatory Visit | Attending: Urology | Admitting: Urology

## 2022-03-27 NOTE — Patient Instructions (Signed)
Your procedure is scheduled on:04-06-22 Monday Report to the Registration Desk on the 1st floor of the [facility].Then proceed to the 2nd floor Surgery Desk  To find out your arrival time, please call [phone] between 1PM - 3PM on:04-03-22 Friday If your arrival time is 6:00 am, do not arrive prior to that time as the [place] entrance doors do not open until 6:00 am.  REMEMBER: Instructions that are not followed completely may result in serious medical risk, up to and including death; or upon the discretion of your surgeon and anesthesiologist your surgery may need to be rescheduled.  Do not eat food OR drink any liquids after midnight the night before surgery. Clear liquids the entire day prior to your Surgery No gum chewing, lozengers or hard candies.  Do NOT take any medication the day of surgery  One week prior to surgery: Stop Anti-inflammatories (NSAIDS) such as Advil, Aleve, Ibuprofen, Motrin, Naproxen, Naprosyn and Aspirin based products such as Excedrin, Goodys Powder, BC Powder.You may however, take Tylenol if needed for pain up until the day of surgery.  Stop ANY OVER THE COUNTER supplements/vitamins NOW (03-27-22) until after surgery.  No Alcohol for 24 hours before or after surgery.  No Smoking including e-cigarettes for 24 hours prior to surgery.  No chewable tobacco products for at least 6 hours prior to surgery.  No nicotine patches on the day of surgery.  Do not use any "recreational" drugs for at least a week prior to your surgery.  Please be advised that the combination of cocaine and anesthesia may have negative outcomes, up to and including death. If you test positive for cocaine, your surgery will be cancelled.  On the morning of surgery brush your teeth with toothpaste and water, you may rinse your mouth with mouthwash if you wish. Do not swallow any toothpaste or mouthwash.  Use CHG Soap as directed on instruction sheet.  Do not wear jewelry,  make-up, hairpins, clips or nail polish.  Do not wear lotions, powders, or perfumes.   Do not shave body from the neck down 48 hours prior to surgery just in case you cut yourself which could leave a site for infection.  Also, freshly shaved skin may become irritated if using the CHG soap.  Contact lenses, hearing aids and dentures may not be worn into surgery.  Do not bring valuables to the hospital. [facility] is not responsible for any missing/lost belongings or valuables.   Notify your doctor if there is any change in your medical condition (cold, fever, infection).  Wear comfortable clothing (specific to your surgery type) to the hospital.  After surgery, you can help prevent lung complications by doing breathing exercises.  Take deep breaths and cough every 1-2 hours. Your doctor may order a device called an Incentive Spirometer to help you take deep breaths. When coughing or sneezing, hold a pillow firmly against your incision with both hands. This is called "splinting." Doing this helps protect your incision. It also decreases belly discomfort.  If you are being admitted to the hospital overnight, leave your suitcase in the car. After surgery it may be brought to your room.  If you are being discharged the day of surgery, you will not be allowed to drive home. You will need a responsible adult (18 years or older) to drive you home and stay with you that night.   If you are taking public transportation, you will need to have a responsible adult (18 years or older)  with you. Please confirm with your physician that it is acceptable to use public transportation.   Please call the [facility]. at [phone] if you have any questions about these instructions.  Surgery Visitation Policy:  Patients undergoing a surgery or procedure may have two family members or support persons with them as long as the person is not COVID-19 positive or experiencing its symptoms.    Inpatient Visitation:    Visiting hours are 7 a.m. to 8 p.m. Up to four visitors are allowed at one time in a patient room, including children. The visitors may rotate out with other people during the day. One designated support person (adult) may remain overnight.

## 2022-03-30 ENCOUNTER — Other Ambulatory Visit: Payer: Self-pay

## 2022-03-30 ENCOUNTER — Encounter
Admission: RE | Admit: 2022-03-30 | Discharge: 2022-03-30 | Disposition: A | Discharge status: Home / Self Care | Payer: BC Managed Care – PPO | Source: Ambulatory Visit | Attending: Urology | Admitting: Urology

## 2022-03-30 DIAGNOSIS — Z01812 Encounter for preprocedural laboratory examination: Secondary | ICD-10-CM | POA: Diagnosis not present

## 2022-03-30 DIAGNOSIS — C61 Malignant neoplasm of prostate: Secondary | ICD-10-CM | POA: Diagnosis not present

## 2022-03-30 LAB — URINALYSIS, COMPLETE (UACMP) WITH MICROSCOPIC
Bacteria, UA: NONE SEEN
Bilirubin Urine: NEGATIVE
Glucose, UA: NEGATIVE mg/dL
Hgb urine dipstick: NEGATIVE
Ketones, ur: NEGATIVE mg/dL
Leukocytes,Ua: NEGATIVE
Nitrite: NEGATIVE
Protein, ur: NEGATIVE mg/dL
Specific Gravity, Urine: 1.029 (ref 1.005–1.030)
Squamous Epithelial / HPF: NONE SEEN (ref 0–5)
pH: 5 (ref 5.0–8.0)

## 2022-03-30 LAB — CBC
HCT: 43.8 % (ref 39.0–52.0)
Hemoglobin: 14.8 g/dL (ref 13.0–17.0)
MCH: 30 pg (ref 26.0–34.0)
MCHC: 33.8 g/dL (ref 30.0–36.0)
MCV: 88.7 fL (ref 80.0–100.0)
Platelets: 168 10*3/uL (ref 150–400)
RBC: 4.94 MIL/uL (ref 4.22–5.81)
RDW: 12.7 % (ref 11.5–15.5)
WBC: 4.3 10*3/uL (ref 4.0–10.5)
nRBC: 0 % (ref 0.0–0.2)

## 2022-03-30 LAB — BASIC METABOLIC PANEL
Anion gap: 8 (ref 5–15)
BUN: 16 mg/dL (ref 8–23)
CO2: 24 mmol/L (ref 22–32)
Calcium: 9.1 mg/dL (ref 8.9–10.3)
Chloride: 109 mmol/L (ref 98–111)
Creatinine, Ser: 1.14 mg/dL (ref 0.61–1.24)
GFR, Estimated: >60 mL/min (ref >60)
Glucose, Bld: 100 mg/dL — ABNORMAL HIGH (ref 70–99)
Potassium: 3.6 mmol/L (ref 3.5–5.1)
Sodium: 141 mmol/L (ref 135–145)

## 2022-03-30 LAB — TYPE AND SCREEN
ABO/RH(D): O POS
Antibody Screen: NEGATIVE

## 2022-03-30 LAB — PROTIME-INR
INR: 1.1 (ref 0.8–1.2)
Prothrombin Time: 13.7 seconds (ref 11.4–15.2)

## 2022-03-31 LAB — URINE CULTURE: Culture: NO GROWTH

## 2022-04-02 ENCOUNTER — Ambulatory Visit: Payer: BC Managed Care – PPO | Attending: Urology | Admitting: Physical Therapy

## 2022-04-02 DIAGNOSIS — R278 Other lack of coordination: Secondary | ICD-10-CM | POA: Insufficient documentation

## 2022-04-02 DIAGNOSIS — M533 Sacrococcygeal disorders, not elsewhere classified: Secondary | ICD-10-CM | POA: Insufficient documentation

## 2022-04-02 NOTE — Therapy (Signed)
OUTPATIENT PHYSICAL THERAPY Treatment    Patient Name: Dillon Humphrey MRN: [id number] DOB:August 28, 1960, 61 y.o., male Today's Date: 04/02/2022   PT End of Session - 04/02/22 0807     Visit Number 5    Number of Visits 10    Date for PT Re-Evaluation 05/15/22    PT Start Time 0803    PT Stop Time 3716    PT Time Calculation (min) 38 min    Activity Tolerance Patient tolerated treatment well    Behavior During Therapy [facility] for tasks assessed/performed             Past Medical History:  Diagnosis Date   History of kidney stones    Past Surgical History:  Procedure Laterality Date   COLONOSCOPY  09/2021   lithotrypsi  2016   PROSTATE BIOPSY N/A 01/15/2022   Procedure: PROSTATE BIOPSY Vernelle Emerald;  Surgeon: Royston Cowper, MD;  Location: [facility] ORS;  Service: Urology;  Laterality: N/A;   There are no problems to display for this patient.   PCP: Baldemar Lenis  REFERRING PROVIDER:  Lemar Livings DIAG: Prostate Cancer   Rationale for Evaluation and Treatment Rehabilitation  THERAPY DIAG:  Sacrococcygeal disorders, not elsewhere classified  Other lack of coordination  ONSET DATE:   SUBJECTIVE:                   SUBJECTIVE STATEMENT on  TODAY 04/02/22:                         Pt noticed he has to think about not holding his breath when doing the deep core exercises.                Pt has his surgery on Monday. Pt 's R midback feels better since moving his computer monitor more center and making his chair seat higher so his elbow and R arm is not raised when using the mouse.  Pt's LBP is better and he thinks modifying his workstation has better because he noticed when he was not working, he did not have the pain.   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     Stiffness occurs mostly upon waking. Sleeps on his back. Started within the last 1-2 months.   3) dull aches in his legs constant "like through to the bone" :  His legs ache at the end of the day when he stands all day.  Pt has had his circulation checked and it seems to be fine. 3/10. No radiating pain. 10-15 years. Denied injuries to tailbone, falls.   PERTINENT HISTORY:  Family Hx of prostate cancer , Fx of ball of his R foot near big toes 10 years ago. Pt wears an inserts.   Used to do push ups and squats.  Plays disc golf once a week with kids.   PAIN:  Are you having pain?No  PRECAUTIONS: None  WEIGHT BEARING RESTRICTIONS No  FALLS:  Has patient fallen in last 6 months? No  LIVING ENVIRONMENT: Lives with: lives with their spouse Lives in: House/apartment Stairs:  Yes: External: 4 steps; none  OCCUPATION: Nurse, learning disability  PLOF: Independent  PATIENT GOALS  Control bladder after surgery   OBJECTIVE:    [facility] PT Assessment - 04/02/22 0839       Coordination   Coordination and Movement Description ab overuse with deep core training             [place] Adult PT Treatment/Exercise - 04/02/22 0840       Neuro Re-ed    Neuro Re-ed Details  cued for deep core levle 1-2     Therapeutic Activity: administered FOTO, discussed returning to PElvic PT post surgery for kegel training          HOME EXERCISE PROGRAM: See pt instruction section    ASSESSMENT:  CLINICAL IMPRESSION:  Pt has met 100% of his goals for prehab for prostate surgery.    Pt 's R midback feels better since moving his computer monitor more center and making his chair seat higher so his elbow and R arm is not raised when using the mouse. Pt's LBP is better and he thinks modifying his workstation has better because he noticed when he was not working, he did not have the pain.     Pt's improving structrual alignment has  helped pt progress to deep core coordination and strengthening which will help with minimizing LBP and risk for incontinence.    Did not advance to kegel strengthening until he returns after surgery. Pt required more time with deep core coordination and required excessive cues for less oblique mm overuse to avoid dyscoordination of pelvic floor.  .   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    OBJECTIVE IMPAIRMENTS decreased activity tolerance, decreased coordination, decreased endurance, decreased mobility, difficulty walking, decreased ROM, decreased strength, decreased safety awareness, hypomobility, increased muscle spasms, impaired flexibility, improper body mechanics, postural dysfunction, and pain.    ACTIVITY LIMITATIONS  self-care,  home chores, work tasks    PARTICIPATION LIMITATIONS:  community, gym activities    [place]     are also affecting patient's functional outcome.    REHAB POTENTIAL: Good   CLINICAL DECISION MAKING: Evolving/moderate complexity   EVALUATION COMPLEXITY: Moderate    PATIENT EDUCATION:    Education details: Showed pt anatomy images. Explained muscles attachments/ connection, physiology of deep core system/ spinal- thoracic-pelvis-lower kinetic chain as they relate to pt's presentation, Sx, and past Hx. Explained what and how these areas of deficits need to be restored to balance and function    See Therapeutic activity / neuromuscular re-education section  Answered pt's questions.   Person educated: Patient Education method: Explanation, Demonstration, Tactile cues, Verbal cues, and Handouts Education comprehension: verbalized understanding, returned demonstration, verbal cues required, tactile cues required, and needs further education     PLAN: PT FREQUENCY: 1x/week   PT DURATION: 4 weeks   PLANNED INTERVENTIONS: Therapeutic  exercises, Therapeutic activity, Neuromuscular re-education, Balance training, Gait training, Patient/Family education, Self Care, Joint mobilization, Spinal mobilization, Moist heat, Taping, and Manual therapy.   PLAN FOR NEXT SESSION: See clinical impression for plan     GOALS: Goals reviewed with patient? Yes  SHORT TERM GOALS: Target date: 03/20/2022   Pt will demo IND with HEP                    Baseline: Not IND  Goal status: MET   LONG TERM GOALS: Target date: 04/03/2022    1.Pt will demo proper deep core coordination without chest breathing and optimal excursion of diaphragm/pelvic floor in order to promote spinal stability and pelvic floor function  Baseline: dyscoordination Goal status: MET  2.  Pt will demo > 5 pt change on FOTO  to improve QOL and function   Lumber baseline  - 94 pts  --> 83 pts  ( lowered score indicatees improved function)   Goal status: MET   3.  Pt will demo proper body mechanics in against gravity tasks and ADLs  work tasks, fitness  to minimize straining pelvic floor / back                  Baseline: not IND, improper form that places strain on pelvic floor                Goal status: MET    4. Pt will demo levelled pelvic girdle and shoulder height in order to progress to deep core strengthening HEP and restore mobility at spine, pelvis, gait, posture   Baseline:  L iliac, R shoulder higher  ( 04/02/22: levelled pelvis)  Goal status: MET    5. Pt will demo proper pelvic floor coordination to advance to kegel program to minimize incontinence Baseline:  no knowledge of kegels  Goal status: MET ( but did not progress to kegels)      Jerl Mina, PT 04/02/2022, 8:43 AM

## 2022-04-06 ENCOUNTER — Encounter
Admission: RE | Disposition: A | Discharge status: Home / Self Care | Payer: Self-pay | Source: Home / Self Care | Attending: Urology

## 2022-04-06 ENCOUNTER — Ambulatory Visit: Payer: BC Managed Care – PPO | Admitting: Urgent Care

## 2022-04-06 ENCOUNTER — Other Ambulatory Visit: Payer: Self-pay

## 2022-04-06 ENCOUNTER — Observation Stay
Admission: RE | Admit: 2022-04-06 | Discharge: 2022-04-07 | Disposition: A | Discharge status: Home / Self Care | Payer: BC Managed Care – PPO | Attending: Urology | Admitting: Urology

## 2022-04-06 ENCOUNTER — Encounter: Payer: Self-pay | Admitting: Urology

## 2022-04-06 DIAGNOSIS — C61 Malignant neoplasm of prostate: Secondary | ICD-10-CM | POA: Diagnosis not present

## 2022-04-06 HISTORY — PX: PROSTATECTOMY: SHX69

## 2022-04-06 HISTORY — PX: ROBOT ASSISTED LAPAROSCOPIC RADICAL PROSTATECTOMY: SHX5141

## 2022-04-06 LAB — ABO/RH: ABO/RH(D): O POS

## 2022-04-06 SURGERY — PROSTATECTOMY, RADICAL, ROBOT-ASSISTED, LAPAROSCOPIC
Anesthesia: General | Site: Abdomen

## 2022-04-06 MED ORDER — CHLORHEXIDINE GLUCONATE 0.12 % MT SOLN: 15.0000 mL | Freq: Once | OROMUCOSAL | Status: AC

## 2022-04-06 MED ORDER — MIDAZOLAM HCL 2 MG/2ML IJ SOLN
INTRAMUSCULAR | Status: AC
  Filled 2022-04-06: qty 2

## 2022-04-06 MED ORDER — LIDOCAINE HCL (PF) 2 % IJ SOLN
INTRAMUSCULAR | Status: AC
  Filled 2022-04-06: qty 5

## 2022-04-06 MED ORDER — MORPHINE SULFATE (PF) 2 MG/ML IV SOLN
2.0000 mg | INTRAVENOUS | Status: DC | PRN
  Administered 2022-04-06: 2 mg via INTRAVENOUS
  Filled 2022-04-06: qty 1

## 2022-04-06 MED ORDER — DEXAMETHASONE SODIUM PHOSPHATE 10 MG/ML IJ SOLN
INTRAMUSCULAR | Status: DC | PRN
  Administered 2022-04-06: 4 mg via INTRAVENOUS

## 2022-04-06 MED ORDER — DOCUSATE SODIUM 100 MG PO CAPS
100.0000 mg | ORAL_CAPSULE | Freq: Two times a day (BID) | ORAL | Status: DC
  Administered 2022-04-06 – 2022-04-07 (×3): 100 mg via ORAL
  Filled 2022-04-06 (×3): qty 1

## 2022-04-06 MED ORDER — LIDOCAINE HCL (CARDIAC) PF 100 MG/5ML IV SOSY
PREFILLED_SYRINGE | INTRAVENOUS | Status: DC | PRN
  Administered 2022-04-06: 60 mg via INTRAVENOUS

## 2022-04-06 MED ORDER — ORAL CARE MOUTH RINSE: 15.0000 mL | Freq: Once | OROMUCOSAL | Status: AC

## 2022-04-06 MED ORDER — PROPOFOL 10 MG/ML IV BOLUS
INTRAVENOUS | Status: AC
  Filled 2022-04-06: qty 40

## 2022-04-06 MED ORDER — OXYCODONE-ACETAMINOPHEN 5-325 MG PO TABS
1.0000 | ORAL_TABLET | ORAL | Status: DC | PRN
  Administered 2022-04-06 – 2022-04-07 (×2): 2 via ORAL
  Filled 2022-04-06 (×4): qty 2

## 2022-04-06 MED ORDER — HEMOSTATIC AGENTS (NO CHARGE) OPTIME
TOPICAL | Status: DC | PRN
  Administered 2022-04-06: 1 via TOPICAL

## 2022-04-06 MED ORDER — HEPARIN SODIUM (PORCINE) 5000 UNIT/ML IJ SOLN
5000.0000 [IU] | Freq: Three times a day (TID) | INTRAMUSCULAR | Status: DC
  Administered 2022-04-06 – 2022-04-07 (×3): 5000 [IU] via SUBCUTANEOUS
  Filled 2022-04-06 (×4): qty 1

## 2022-04-06 MED ORDER — ROCURONIUM BROMIDE 100 MG/10ML IV SOLN
INTRAVENOUS | Status: DC | PRN
  Administered 2022-04-06: 20 mg via INTRAVENOUS
  Administered 2022-04-06: 50 mg via INTRAVENOUS
  Administered 2022-04-06: 20 mg via INTRAVENOUS

## 2022-04-06 MED ORDER — DIPHENHYDRAMINE HCL 50 MG/ML IJ SOLN: 12.5000 mg | Freq: Four times a day (QID) | INTRAMUSCULAR | Status: DC | PRN

## 2022-04-06 MED ORDER — FENTANYL CITRATE (PF) 100 MCG/2ML IJ SOLN
INTRAMUSCULAR | Status: AC
  Filled 2022-04-06: qty 2

## 2022-04-06 MED ORDER — SODIUM CHLORIDE 0.9 % IV SOLN: INTRAVENOUS | Status: DC

## 2022-04-06 MED ORDER — LACTATED RINGERS IV SOLN: INTRAVENOUS | Status: DC

## 2022-04-06 MED ORDER — ONDANSETRON HCL 4 MG/2ML IJ SOLN: 4.0000 mg | INTRAMUSCULAR | Status: DC | PRN

## 2022-04-06 MED ORDER — STERILE WATER FOR IRRIGATION IR SOLN
Status: DC | PRN
  Administered 2022-04-06: 3000 mL

## 2022-04-06 MED ORDER — ACETAMINOPHEN 10 MG/ML IV SOLN
INTRAVENOUS | Status: AC
  Filled 2022-04-06: qty 100

## 2022-04-06 MED ORDER — PROPOFOL 10 MG/ML IV BOLUS
INTRAVENOUS | Status: DC | PRN
  Administered 2022-04-06: 170 mg via INTRAVENOUS

## 2022-04-06 MED ORDER — BUPIVACAINE HCL 0.5 % IJ SOLN
INTRAMUSCULAR | Status: DC | PRN
  Administered 2022-04-06: 20 mL

## 2022-04-06 MED ORDER — DIPHENHYDRAMINE HCL 12.5 MG/5ML PO ELIX: 12.5000 mg | ORAL_SOLUTION | Freq: Four times a day (QID) | ORAL | Status: DC | PRN

## 2022-04-06 MED ORDER — SUGAMMADEX SODIUM 200 MG/2ML IV SOLN
INTRAVENOUS | Status: DC | PRN
  Administered 2022-04-06: 200 mg via INTRAVENOUS

## 2022-04-06 MED ORDER — PROMETHAZINE HCL 25 MG/ML IJ SOLN: 6.2500 mg | INTRAMUSCULAR | Status: DC | PRN

## 2022-04-06 MED ORDER — ACETAMINOPHEN 325 MG PO TABS
650.0000 mg | ORAL_TABLET | ORAL | Status: DC | PRN
  Administered 2022-04-07 (×2): 650 mg via ORAL
  Filled 2022-04-06 (×2): qty 2

## 2022-04-06 MED ORDER — CEFAZOLIN SODIUM-DEXTROSE 2-4 GM/100ML-% IV SOLN
INTRAVENOUS | Status: AC
  Filled 2022-04-06: qty 100

## 2022-04-06 MED ORDER — BUPIVACAINE HCL (PF) 0.5 % IJ SOLN
INTRAMUSCULAR | Status: AC
  Filled 2022-04-06: qty 30

## 2022-04-06 MED ORDER — CHLORHEXIDINE GLUCONATE 0.12 % MT SOLN
OROMUCOSAL | Status: AC
  Administered 2022-04-06: 15 mL via OROMUCOSAL
  Filled 2022-04-06: qty 15

## 2022-04-06 MED ORDER — SURGIFLO WITH THROMBIN (HEMOSTATIC MATRIX KIT) OPTIME
TOPICAL | Status: DC | PRN
  Administered 2022-04-06: 1 via TOPICAL

## 2022-04-06 MED ORDER — FENTANYL CITRATE (PF) 100 MCG/2ML IJ SOLN
INTRAMUSCULAR | Status: DC | PRN
  Administered 2022-04-06 (×4): 50 ug via INTRAVENOUS

## 2022-04-06 MED ORDER — ACETAMINOPHEN 10 MG/ML IV SOLN
INTRAVENOUS | Status: DC | PRN
  Administered 2022-04-06: 1000 mg via INTRAVENOUS

## 2022-04-06 MED ORDER — OXYBUTYNIN CHLORIDE 5 MG PO TABS
ORAL_TABLET | ORAL | Status: AC
  Filled 2022-04-06: qty 1

## 2022-04-06 MED ORDER — FENTANYL CITRATE (PF) 100 MCG/2ML IJ SOLN
25.0000 ug | INTRAMUSCULAR | Status: DC | PRN
  Administered 2022-04-06 (×3): 25 ug via INTRAVENOUS

## 2022-04-06 MED ORDER — MIDAZOLAM HCL 2 MG/2ML IJ SOLN
INTRAMUSCULAR | Status: DC | PRN
  Administered 2022-04-06: 2 mg via INTRAVENOUS

## 2022-04-06 MED ORDER — ONDANSETRON HCL 4 MG/2ML IJ SOLN
INTRAMUSCULAR | Status: DC | PRN
  Administered 2022-04-06: 4 mg via INTRAVENOUS

## 2022-04-06 MED ORDER — FENTANYL CITRATE PF 50 MCG/ML IJ SOSY
PREFILLED_SYRINGE | INTRAMUSCULAR | Status: AC
  Filled 2022-04-06: qty 1

## 2022-04-06 MED ORDER — ROCURONIUM BROMIDE 10 MG/ML (PF) SYRINGE
PREFILLED_SYRINGE | INTRAVENOUS | Status: AC
  Filled 2022-04-06: qty 10

## 2022-04-06 MED ORDER — FAMOTIDINE 20 MG PO TABS
ORAL_TABLET | ORAL | Status: AC
  Administered 2022-04-06: 20 mg via ORAL
  Filled 2022-04-06: qty 1

## 2022-04-06 MED ORDER — FAMOTIDINE 20 MG PO TABS: 20.0000 mg | ORAL_TABLET | Freq: Once | ORAL | Status: AC

## 2022-04-06 MED ORDER — OXYBUTYNIN CHLORIDE 5 MG PO TABS
5.0000 mg | ORAL_TABLET | Freq: Three times a day (TID) | ORAL | Status: DC | PRN
  Administered 2022-04-06 – 2022-04-07 (×3): 5 mg via ORAL
  Filled 2022-04-06 (×2): qty 1

## 2022-04-06 MED ORDER — CEFAZOLIN SODIUM-DEXTROSE 2-4 GM/100ML-% IV SOLN
2.0000 g | INTRAVENOUS | Status: AC
  Administered 2022-04-06: 2 g via INTRAVENOUS

## 2022-04-06 MED ORDER — CEFAZOLIN SODIUM-DEXTROSE 1-4 GM/50ML-% IV SOLN
1.0000 g | Freq: Three times a day (TID) | INTRAVENOUS | Status: AC
  Administered 2022-04-06 – 2022-04-07 (×2): 1 g via INTRAVENOUS
  Filled 2022-04-06 (×2): qty 50

## 2022-04-06 MED ORDER — STERILE WATER FOR IRRIGATION IR SOLN
Status: DC | PRN
  Administered 2022-04-06: 500 mL

## 2022-04-06 SURGICAL SUPPLY — 88 items
ANCHOR TIS RET SYS 235ML (MISCELLANEOUS) ×1 IMPLANT
APPLICATOR SURGIFLO ENDO (HEMOSTASIS) ×1 IMPLANT
APPLIER CLIP LOGIC TI 5 (MISCELLANEOUS) IMPLANT
BAG URINE DRAIN 2000ML AR STRL (UROLOGICAL SUPPLIES) ×1 IMPLANT
BLADE CLIPPER SURG (BLADE) ×1 IMPLANT
BULB RESERV EVAC DRAIN JP 100C (MISCELLANEOUS) IMPLANT
CATH DRAINAGE MALECOT 26FR (CATHETERS) ×1 IMPLANT
CATH FOL 2WAY LX 18X5 (CATHETERS) ×2 IMPLANT
CATH MALECOT (CATHETERS) ×1
CLIP LIGATING HEM O LOK PURPLE (MISCELLANEOUS) ×4 IMPLANT
COVER TIP SHEARS 8 DVNC (MISCELLANEOUS) ×1 IMPLANT
COVER TIP SHEARS 8MM DA VINCI (MISCELLANEOUS) ×1
DEFOGGER SCOPE WARMER CLEARIFY (MISCELLANEOUS) ×1 IMPLANT
DERMABOND ADVANCED .7 DNX12 (GAUZE/BANDAGES/DRESSINGS) ×1 IMPLANT
DRAIN CHANNEL JP 15F RND 16 (MISCELLANEOUS) IMPLANT
DRAIN CHANNEL JP 19F (MISCELLANEOUS) IMPLANT
DRAPE 3/4 80X56 (DRAPES) ×1 IMPLANT
DRAPE ARM DVNC X/XI (DISPOSABLE) ×4 IMPLANT
DRAPE COLUMN DVNC XI (DISPOSABLE) ×1 IMPLANT
DRAPE DA VINCI XI ARM (DISPOSABLE) ×4
DRAPE DA VINCI XI COLUMN (DISPOSABLE) ×1
DRAPE LEGGINS SURG 28X43 STRL (DRAPES) ×1 IMPLANT
DRAPE SURG 17X11 SM STRL (DRAPES) ×4 IMPLANT
DRAPE UNDER BUTTOCK W/FLU (DRAPES) ×1 IMPLANT
DRSG TELFA 3X8 NADH STRL (GAUZE/BANDAGES/DRESSINGS) ×1 IMPLANT
ELECT REM PT RETURN 9FT ADLT (ELECTROSURGICAL) ×1
ELECTRODE REM PT RTRN 9FT ADLT (ELECTROSURGICAL) ×1 IMPLANT
GLOVE BIO SURGEON STRL SZ 6.5 (GLOVE) ×2 IMPLANT
GLOVE SURG UNDER POLY LF SZ7.5 (GLOVE) ×1 IMPLANT
GOWN STRL REUS W/ TWL LRG LVL3 (GOWN DISPOSABLE) ×2 IMPLANT
GOWN STRL REUS W/ TWL XL LVL3 (GOWN DISPOSABLE) ×1 IMPLANT
GOWN STRL REUS W/TWL LRG LVL3 (GOWN DISPOSABLE) ×2
GOWN STRL REUS W/TWL XL LVL3 (GOWN DISPOSABLE) ×1
GRASPER SUT TROCAR 14GX15 (MISCELLANEOUS) ×1 IMPLANT
HEMOSTAT SURGICEL 2X14 (HEMOSTASIS) IMPLANT
HOLDER FOLEY CATH W/STRAP (MISCELLANEOUS) ×1 IMPLANT
IRRIGATION STRYKERFLOW (MISCELLANEOUS) ×1 IMPLANT
IRRIGATOR STRYKERFLOW (MISCELLANEOUS) ×1
KIT PINK PAD W/HEAD ARE REST (MISCELLANEOUS) ×1
KIT PINK PAD W/HEAD ARM REST (MISCELLANEOUS) ×1 IMPLANT
LABEL OR SOLS (LABEL) ×1 IMPLANT
MANIFOLD NEPTUNE II (INSTRUMENTS) ×1 IMPLANT
MARKER SKIN DUAL TIP RULER LAB (MISCELLANEOUS) ×1 IMPLANT
NDL INSUFFLATION 14GA 120MM (NEEDLE) ×1 IMPLANT
NEEDLE HYPO 22GX1.5 SAFETY (NEEDLE) ×1 IMPLANT
NEEDLE INSUFFLATION 14GA 120MM (NEEDLE) ×1 IMPLANT
NS IRRIG 500ML POUR BTL (IV SOLUTION) ×1 IMPLANT
OBTURATOR OPTICAL STANDARD 8MM (TROCAR) ×1
OBTURATOR OPTICAL STND 8 DVNC (TROCAR) ×1
OBTURATOR OPTICALSTD 8 DVNC (TROCAR) ×1 IMPLANT
PACK LAP CHOLECYSTECTOMY (MISCELLANEOUS) ×1 IMPLANT
PENCIL SMOKE EVACUATOR (MISCELLANEOUS) IMPLANT
RELOAD STAPLE 60 2.6 WHT THN (STAPLE) IMPLANT
RELOAD STAPLER WHITE 60MM (STAPLE) ×1 IMPLANT
SEAL CANN UNIV 5-8 DVNC XI (MISCELLANEOUS) ×4 IMPLANT
SEAL XI 5MM-8MM UNIVERSAL (MISCELLANEOUS) ×4
SET TUBE SMOKE EVAC HIGH FLOW (TUBING) ×1 IMPLANT
SOLUTION ELECTROLUBE (MISCELLANEOUS) ×1 IMPLANT
SPONGE T-LAP 4X18 ~~LOC~~+RFID (SPONGE) ×1 IMPLANT
SPONGE VERSALON 4X4 4PLY (MISCELLANEOUS) IMPLANT
STAPLE ECHEON FLEX 60 POW ENDO (STAPLE) IMPLANT
STAPLER RELOAD WHITE 60MM (STAPLE) ×1
STAPLER SKIN PROX 35W (STAPLE) ×1 IMPLANT
STRAP SAFETY 5IN WIDE (MISCELLANEOUS) ×2 IMPLANT
SURGIFLO W/THROMBIN 8M KIT (HEMOSTASIS) ×1 IMPLANT
SURGILUBE 2OZ TUBE FLIPTOP (MISCELLANEOUS) ×1 IMPLANT
SUT DVC VLOC 90 3-0 CV23 UNDY (SUTURE) ×2 IMPLANT
SUT DVC VLOC 90 3-0 CV23 VLT (SUTURE) ×1
SUT ETHILON 3-0 FS-10 30 BLK (SUTURE)
SUT MNCRL 4-0 (SUTURE) ×2
SUT MNCRL 4-0 27XMFL (SUTURE) ×2
SUT SILK 2 0 SH (SUTURE) IMPLANT
SUT VIC AB 0 CT1 36 (SUTURE) ×2 IMPLANT
SUT VIC AB 2-0 SH 27 (SUTURE)
SUT VIC AB 2-0 SH 27XBRD (SUTURE) IMPLANT
SUT VICRYL 0 AB UR-6 (SUTURE) ×1 IMPLANT
SUTURE DVC VLC 90 3-0 CV23 VLT (SUTURE) ×1 IMPLANT
SUTURE EHLN 3-0 FS-10 30 BLK (SUTURE) IMPLANT
SUTURE MNCRL 4-0 27XMF (SUTURE) ×2 IMPLANT
SYR 10ML LL (SYRINGE) ×1 IMPLANT
SYR BULB IRRIG 60ML STRL (SYRINGE) ×1 IMPLANT
SYR TOOMEY 50ML (SYRINGE) ×2 IMPLANT
TAPE CLOTH 3X10 WHT NS LF (GAUZE/BANDAGES/DRESSINGS) ×2 IMPLANT
TRAP FLUID SMOKE EVACUATOR (MISCELLANEOUS) ×1 IMPLANT
TROCAR XCEL 12X100 BLDLESS (ENDOMECHANICALS) ×1 IMPLANT
TROCAR XCEL NON-BLD 5MMX100MML (ENDOMECHANICALS) ×1 IMPLANT
WATER STERILE IRR 3000ML UROMA (IV SOLUTION) ×1 IMPLANT
WATER STERILE IRR 500ML POUR (IV SOLUTION) ×1 IMPLANT

## 2022-04-06 NOTE — TOC Initial Note (Signed)
Transition of Care [facility]) - Initial/Assessment Note    Patient Details  Name: Dillon Humphrey MRN: [id number] Date of Birth: May 29, 1961  Transition of Care [facility]) CM/SW Contact:    Beverly Sessions, RN Phone Number: 04/06/2022, 4:12 PM  Clinical Narrative:                   Transition of Care [facility]) Screening Note   Patient Details  Name: Dillon Humphrey Date of Birth: [birth year]   Transition of Care [facility]) CM/SW Contact:    Beverly Sessions, RN Phone Number: 04/06/2022, 4:12 PM    Transition of Care Department [facility]) has reviewed patient and no TOC needs have been identified at this time. We will continue to monitor patient advancement through interdisciplinary progression rounds. If new patient transition needs arise, please place a TOC consult.         Patient Goals and CMS Choice        Expected Discharge Plan and Services                                                Prior Living Arrangements/Services                       Activities of Daily Living Home Assistive Devices/Equipment: None ADL Screening (condition at time of admission) Patient's cognitive ability adequate to safely complete daily activities?: Yes Is the patient deaf or have difficulty hearing?: No Does the patient have difficulty seeing, even when wearing glasses/contacts?: No Does the patient have difficulty concentrating, remembering, or making decisions?: No Patient able to express need for assistance with ADLs?: Yes Does the patient have difficulty dressing or bathing?: No Independently performs ADLs?: Yes (appropriate for developmental age) Does the patient have difficulty walking or climbing stairs?: No Weakness of Legs: None Weakness of Arms/Hands: None  Permission Sought/Granted                  Emotional Assessment              Admission diagnosis:  Prostate cancer [facility]) [C61] Patient Active Problem List   Diagnosis Date Noted   Prostate cancer  ([place]) 04/06/2022   PCP:  Derinda Late, MD Pharmacy:   [place], [place] [address] [address] Phone: [phone] Fax: [phone]     Social Determinants of Health (SDOH) Interventions    Readmission Risk Interventions     No data to display

## 2022-04-06 NOTE — Op Note (Signed)
04/06/22  PREOPERATIVE DIAGNOSIS: Prostate cancer.  POSTOPERATIVE DIAGNOSIS: Prostate cancer.  OPERATION PERFORMED: 1. DaVinci laparoscopic radical prostatectomy (bilatearl nerve sparing) 2 DaVinci laproscopic bilateral pelvic lymph node dissection.  SURGEON: Hollice Espy, MD  ASSISTANTS: Nickolas Madrid, MD  ANESTHESIA: General.  EBL: 100 cc  SPECIMEN: Prostate with bilateral seminal vesicals, anterior fat pad.  FINDINGS: Accessory Pudendal Vessel: none  INDICATION: Pt.is a 61 year old male with Gleason low risk prostate cancer. Treatment options were discussed with him at length and he chose DaVinci radical prostatectomy.  Good baseline erections therefore plan for bilateral nerve sparing was discussed. Bilateral pelvic lymph node dissection was planned due to his risk stratification.  PROCEDURE IN DETAIL: Patient was given appropriate perioperative antibiotics. He had sequential compression devices applied preoperatively for DVT prophylaxis. He was taken to the operating room where he was induced with general anesthesia. After adequate anesthesia, he was placed in the dorsal lithotomy position. His arms were draped by his side and was appropriately padded and secured to the operating room table. He was placed in the Trendelenburg position.  He was prepped and draped in sterile fashion. An 98 French Foley was placed in the bladder and instilled with 15 cc sterile water. Orogastric tube was placed. The Veress needle was passed just above the umbilicus and the abdomen was insufflated to 15 atmospheres. An 8 mm, blunt-tip trocar was placed just above the umbilicus. The zero-degree camera was passed within this and the following trocars were placed under direct vision; 8 mm robotic trocars were placed 9 cm laterally and inferiorly to the initially placed umbilical trocar. A third one was placed 7 cm lateral to the left-sided trocar. In the corresponding position  on the right side, a 12 mm trocar was placed, and then a 5 mm trocar was placed to the right and well above the umbilicus.  The 12 mm assistant port site was preclosed using 0 Vicryl suture using a Carter-Thomason which was tied down at the end of the case to close this port site.  The robot was then docked with the robot trocar. I used the zero-degree camera. I had the hot scissors in the right hand and the left hand with the Wisconsin bipolar and far left hand the Prograsp forceps. Initially I divided the median umbilical ligament bilaterally and the urachus and developed the space of Retzius down to pubic bone. I divided the parietal peritoneum laterally up to the vas deferens on each side. I used the prograsp forceps to provide cranial traction on the urachus. I cleaned off the Endopelvic fascia on each side and then divided it with the scissors laterally to the perirectal fat and medially to the puboprostatic ligaments which were divided. I then ligated the dorsal vein complex using a 60 mm vascular load stapler.   I then addressed the bladder neck with a 30-degree down lens. I identified the bladder neck by pulling on the Foley catheter. I divided the anterior bladder neck musculature until I then found the anterior bladder neck mucosa which was incised. I identified the Foley catheter within, deflated the balloon, pulled the Foley out through this opening and then using the Carter-Thomason needle with a #0-Vicryl suture, passed through The suprapubic region and pulled the suture through the eye of the Foley and then back out. This allowed me to provide upward traction on the prostate. I then divided the lateral bladder neck mucosa and the posterior bladder neck mucosa. I was well away from ureteral orifices. I divided the posterior  bladder neck musculature until I identified the vas deferens. They were freed proximally, then divided. I freed up the seminal vesicals using blunt  and sharp dissection. Judicious use of electrocautery was used near the seminal vesicle tips to avoid injury to neurovascular bundle.   I then went back to the 0-degree lens. I divided the Denonvilliers fascia beneath the prostate and developed the prostate off the rectum. I then did a bilateral nerve sparing by  creating a plane which was intrafascial. I then isolated the pedicles of the prostate and placed weck clips on the pedicles of the prostate and then divided it with cold scissors. I continued to divide the neurovascular bundles off the prostate out to the apex of the prostate. At this point the prostate was freed up except for the urethra. I addressed the prostate anteriorly, divided the dorsal vein , then the anterior urethral wall, pulled the Foley catheter back and then divided the posterior urethral wall. Specimen was completely freed up. I placed the prostate in an Endo catch bag and then placed the bag in the upper abdomen out of the way. I then irrigated the pelvis. The rectal test was negative. There was reasonable hemostasis.  With good hemostasis, I then did the posterior reconstruction. I used a 3-0 VLoc suture through the cut edge of Denonvilliers fascia beneath the bladder on the right side and through the posterior striated sphincter underneath the urethra. This brought the bladder neck and urethra and closer proximity to help facilitate anastomosis.   I then did the urethral vesicle anastomosis again with two 3-0 VLoc sutures interlocked. I passed both ends of the suture from the outside-in through the bladder neck at the 6 o'clock position. I passed both through the urethral stump from the inside-out in the corresponding position. I reapproximated the bladder neck to the urethra. I then ran the Left suture on the left side anastomosis to the 9 o'clock position. Then I went back to the right sided suture and ran that up the right side to the 12 o'clock position.  I then continued the left suture to the 12 o'clock position. The suture was then suspended anteriorly behind the pubic bone.   I then placed a new 46 French Foley into the bladder and filled it with 10 cc sterile water. I irrigated the bladder with 160 cc. There was no leakage. There was reasonable hemostasis.  Surgicel was used on either side of the pedicles for an additional hemostasis.  Surgi-Flo was also used. The instruments were then removed. The robot was undocked and all the trocars were removed under direct vision. There was good hemostasis. I then enlarged the umbilical trocar site large enough to remove the prostate and I closed the fascia here with #0-0 Vicryl suture in a running fashion. All the port sites were irrigated. Lidocaine was injected into all the trocar sites. The skin was closed with 4-0 Monocryl in running subcuticular fashion. Dermabond was applied.   At this point patient was awakened and extubated in the operating room and taken to the recovery room in stable condition. There were no complications. All counts correct.  An assistant was required for this surgical procedure. The duties of the assistant included but were not limited to suctioning, passing suture, camera manipulation, retraction. This procedure would not be able to be performed without an Environmental consultant.    Hollice Espy, MD

## 2022-04-06 NOTE — Anesthesia Preprocedure Evaluation (Signed)
Anesthesia Evaluation  Patient identified by MRN, date of birth, ID band Patient awake    Reviewed: Allergy & Precautions, NPO status , Patient's Chart, lab work & pertinent test results  History of Anesthesia Complications Negative for: history of anesthetic complications  Airway Mallampati: III  TM Distance: >3 FB Neck ROM: full    Dental no notable dental hx.    Pulmonary neg pulmonary ROS   Pulmonary exam normal        Cardiovascular negative cardio ROS Normal cardiovascular exam     Neuro/Psych negative neurological ROS  negative psych ROS   GI/Hepatic negative GI ROS, Neg liver ROS,,,  Endo/Other  negative endocrine ROS    Renal/GU negative Renal ROS  negative genitourinary   Musculoskeletal   Abdominal Normal abdominal exam  (+)   Peds  Hematology negative hematology ROS (+)   Anesthesia Other Findings Elevated PSA  Past Medical History: No date: History of kidney stones  Past Surgical History: 09/2021: COLONOSCOPY 2016: lithotrypsi  BMI    Body Mass Index: 24.41 kg/m      Reproductive/Obstetrics negative OB ROS                             Anesthesia Physical Anesthesia Plan  ASA: 2  Anesthesia Plan: General   Post-op Pain Management:    Induction: Intravenous  PONV Risk Score and Plan: 2 and Ondansetron, Dexamethasone, Midazolam and Treatment may vary due to age or medical condition  Airway Management Planned: Oral ETT  Additional Equipment:   Intra-op Plan:   Post-operative Plan: Extubation in OR  Informed Consent: I have reviewed the patients History and Physical, chart, labs and discussed the procedure including the risks, benefits and alternatives for the proposed anesthesia with the patient or authorized representative who has indicated his/her understanding and acceptance.     Dental Advisory Given  Plan Discussed with: Anesthesiologist, CRNA  and Surgeon  Anesthesia Plan Comments:         Anesthesia Quick Evaluation

## 2022-04-06 NOTE — Anesthesia Procedure Notes (Signed)
Procedure Name: Intubation Date/Time: 04/06/2022 7:42 AM  Performed by: Hiilei Gerst, CRNAPre-anesthesia Checklist: Patient identified, Patient being monitored, Timeout performed, Emergency Drugs available and Suction available Patient Re-evaluated:Patient Re-evaluated prior to induction Oxygen Delivery Method: Circle system utilized Preoxygenation: Pre-oxygenation with 100% oxygen Induction Type: IV induction Ventilation: Mask ventilation without difficulty Laryngoscope Size: Miller and 2 Grade View: Grade I Tube type: Oral Tube size: 7.5 mm Number of attempts: 1 Placement Confirmation: ETT inserted through vocal cords under direct vision, positive ETCO2 and breath sounds checked- equal and bilateral Secured at: 21 cm Tube secured with: Tape Dental Injury: Teeth and Oropharynx as per pre-operative assessment

## 2022-04-06 NOTE — Transfer of Care (Signed)
Immediate Anesthesia Transfer of Care Note  Patient: Dillon Humphrey  Procedure(s) Performed: XI ROBOTIC ASSISTED LAPAROSCOPIC RADICAL PROSTATECTOMY (Abdomen)  Patient Location: PACU  Anesthesia Type:General  Level of Consciousness: awake, alert , and oriented  Airway & Oxygen Therapy: Patient Spontanous Breathing and Patient connected to face mask oxygen  Post-op Assessment: Report given to RN and Post -op Vital signs reviewed and stable  Post vital signs: Reviewed and stable  Last Vitals:  Vitals Value Taken Time  BP    Temp    Pulse    Resp    SpO2      Last Pain:  Vitals:   04/06/22 0655  TempSrc: Temporal  PainSc: 0-No pain         Complications: No notable events documented.

## 2022-04-06 NOTE — H&P (Signed)
04/06/22 R[facility] CTAB   Dillon Humphrey Nov 23, 1960 [id number]   Referring provider: Derinda Late, MD [id number] [place],  [place] [address]       HPI: 61 year old male referred by Dr. Baruch Gouty for consideration of radical prostatectomy.   He was initially seen and evaluated by Dr. Yves Dill.  He had a prostate MRI which was suspicious and underwent UroNav.   Prostate MRI indicated 2 PI-RADS 5 lesions on the right anterior transition zone and anterior fibromuscular stroma concerning for possible trans capsular spread.  No lymphadenopathy or any other evidence of aggressive disease.   Prostate biopsy revealed -12 core biopsies however 3 of 5 cores positive for Gleason 3+3 up to 10% at region of interest #1 and 1 of 4 cores Gleason 3+3 in region of interest 2.   Prostate volume 36 cc based on MRI.  PSA 5.65.     IPSS       Row Name 02/04/22 1100                   International Prostate Symptom Score    How often have you had the sensation of not emptying your bladder? Less than 1 in 5        How often have you had to urinate less than every two hours? Less than 1 in 5 times        How often have you found you stopped and started again several times when you urinated? Less than 1 in 5 times        How often have you found it difficult to postpone urination? Less than 1 in 5 times        How often have you had a weak urinary stream? More than half the time        How often have you had to strain to start urination? Not at All        How many times did you typically get up at night to urinate? None        Total IPSS Score 8               Quality of Life due to urinary symptoms    If you were to spend the rest of your life with your urinary condition just the way it is now how would you feel about that? Mostly Satisfied                      Score:  1-7 Mild 8-19 Moderate 20-35 Severe     SHIM       Row Name 02/04/22  1135                   SHIM: Over the last 6 months:    How do you rate your confidence that you could get and keep an erection? Very Low        When you had erections with sexual stimulation, how often were your erections hard enough for penetration (entering your partner)? Almost Never or Never        During sexual intercourse, how often were you able to maintain your erection after you had penetrated (entered) your partner? Almost Never or Never        During sexual intercourse, how difficult was it to maintain your erection to completion of intercourse? Extremely Difficult        When you attempted  sexual intercourse, how often was it satisfactory for you? A Few Times (much less than half the time)               SHIM Total Score    SHIM 6                        PMH:     Past Medical History:  Diagnosis Date   History of kidney stones        Surgical History:      Past Surgical History:  Procedure Laterality Date   COLONOSCOPY   09/2021   lithotrypsi   2016   PROSTATE BIOPSY N/A 01/15/2022    Procedure: PROSTATE BIOPSY Vernelle Emerald;  Surgeon: Royston Cowper, MD;  Location: [facility] ORS;  Service: Urology;  Laterality: N/A;      Home Medications:  Allergies as of 02/04/2022   No Known Allergies         Medication List     as of February 04, 2022 11:59 PM    You have not been prescribed any medications.        Allergies: No Known Allergies   Family History:      Family History  Problem Relation Age of Onset   Cancer Father 6        glioblasoma      Social History:  reports that he has never smoked. He has never used smokeless tobacco. He reports that he does not currently use alcohol. He reports that he does not currently use drugs.     Physical Exam: BP (!) 147/86   Pulse 75   Ht '5\' 11"'$  (1.803 m)   Wt 174 lb (78.9 kg)   BMI 24.27 kg/m   Constitutional:  Alert and oriented, No acute distress. HEENT: [place] AT, moist mucus membranes.  Trachea midline, no  masses. Cardiovascular: No clubbing, cyanosis, or edema. Respiratory: Normal respiratory effort, no increased work of breathing. GI: Abdomen is soft, nontender, nondistended, no abdominal masses GU: No CVA tenderness Skin: No rashes, bruises or suspicious lesions. Neurologic: Grossly intact, no focal deficits, moving all 4 extremities. Psychiatric: Normal mood and affect.     Pertinent Imaging: IMPRESSION: 1. Two PI-RADS category 5 lesions in the right anterior transition zone and anterior fibromuscular stroma. 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 Targeting data sent to [place].   I personally reviewed the images from the above MRI.  There are some bulging of the capsule.  Unclear if there is evidence of TRUS capsular spread.   Assessment & Plan:     1. Prostate cancer [facility]) Newly diagnosed low risk prostate cancer-   The patient was counseled about the natural history of prostate cancer and the standard treatment options that are available for prostate cancer. It was explained to him how his age and life expectancy, clinical stage, Gleason score, and PSA affect his prognosis, the decision to proceed with additional staging studies, as well as how that information influences recommended treatment strategies. We discussed the roles for active surveillance, radiation therapy, surgical therapy, androgen deprivation, as well as ablative therapy options for the treatment of prostate cancer as appropriate to his individual cancer situation. We discussed the risks and benefits of these options with regard to their impact on cancer control and also in terms of potential adverse events, complications, and impact on quality of life particularly related to urinary, bowel, and sexual function. The patient was encouraged to ask questions throughout the discussion today and all questions  were answered to his stated satisfaction. In addition, the patient was provided with and/or directed to appropriate resources  and literature for further education about prostate cancer treatment options.   We discussed the NCCN guidelines as well as the below, technically falls into the low risk category and active surveillance would be the strongest recommendation per NCCN and AUA guidelines.  That being said, based on his MRI, there is evidence of bulging of the capsule and even the possibility of transcapsular spread which is not in alignment with his pathology results are PSA and there is a clinical discrepancy here.  He also has a family history which is also somewhat worrisome.  We discussed various options including the below as well as cancer genetics to help possibly restratify or predict the degree of aggressiveness of the disease.   We discussed options including going ahead and proceeding with radiation, surgery, and strongest recommendation at this point to repeat MRI/PSA in 6 months and consider repeat biopsy.   We discussed surgical therapy for prostate cancer including the different available surgical approaches.  Specifically, we discussed robotic prostatectomy without pelvic lymph node dissection based on his restratification.  We discussed, in detail, the risks and expectations of surgery with regard to cancer control, urinary control, and erectile dysfunction as well as expected post operative recovery processed. Additional risks of surgery including but not limited to bleeding, infection, hernia formation, nerve damage, fistula formation, bowel/rectal injury, potentially necessitating colostomy, damage to the urinary tract resulting in urinary leakage, urethral stricture, and cardiopulmonary risk such as myocardial infarction, stroke, death, thromboembolism etc. were explained.    We had a lengthy conversation today.  He like to go home and discuss this with his wife.  Given his family history and concerning MRI findings, he may just want to go ahead and proceed with surgery.  I will support any of his  decisions as long as they are informed and is considered all the oncologic as well as quality of life outcome possibilities which were discussed extensively.     Hollice Espy, MD   [facility] [address] [place], [place] [address] [phone]

## 2022-04-07 ENCOUNTER — Encounter: Payer: Self-pay | Admitting: Urology

## 2022-04-07 DIAGNOSIS — C61 Malignant neoplasm of prostate: Secondary | ICD-10-CM

## 2022-04-07 LAB — CBC
HCT: 35.8 % — ABNORMAL LOW (ref 39.0–52.0)
Hemoglobin: 12.4 g/dL — ABNORMAL LOW (ref 13.0–17.0)
MCH: 30.5 pg (ref 26.0–34.0)
MCHC: 34.6 g/dL (ref 30.0–36.0)
MCV: 88.2 fL (ref 80.0–100.0)
Platelets: 182 10*3/uL (ref 150–400)
RBC: 4.06 MIL/uL — ABNORMAL LOW (ref 4.22–5.81)
RDW: 12.5 % (ref 11.5–15.5)
WBC: 6.9 10*3/uL (ref 4.0–10.5)
nRBC: 0 % (ref 0.0–0.2)

## 2022-04-07 LAB — BASIC METABOLIC PANEL
Anion gap: 6 (ref 5–15)
BUN: 10 mg/dL (ref 8–23)
CO2: 24 mmol/L (ref 22–32)
Calcium: 8.3 mg/dL — ABNORMAL LOW (ref 8.9–10.3)
Chloride: 110 mmol/L (ref 98–111)
Creatinine, Ser: 1.21 mg/dL (ref 0.61–1.24)
GFR, Estimated: >60 mL/min (ref >60)
Glucose, Bld: 106 mg/dL — ABNORMAL HIGH (ref 70–99)
Potassium: 3.5 mmol/L (ref 3.5–5.1)
Sodium: 140 mmol/L (ref 135–145)

## 2022-04-07 MED ORDER — OXYBUTYNIN CHLORIDE 5 MG PO TABS: 5.0000 mg | ORAL_TABLET | Freq: Three times a day (TID) | ORAL | 0 refills | Status: DC | PRN

## 2022-04-07 MED ORDER — OXYCODONE-ACETAMINOPHEN 5-325 MG PO TABS: 1.0000 | ORAL_TABLET | Freq: Four times a day (QID) | ORAL | 0 refills | Status: DC | PRN

## 2022-04-07 MED ORDER — DOCUSATE SODIUM 100 MG PO CAPS: 100.0000 mg | ORAL_CAPSULE | Freq: Two times a day (BID) | ORAL | 0 refills | Status: DC

## 2022-04-07 NOTE — Plan of Care (Signed)
Pt alert and oriented x 4. Vitals stable. Pt received 1 dose of pain meds this shift. 2 percocets given.  Problem: Education: Goal: Knowledge of the procedure and recovery process will improve Outcome: Progressing   Problem: Bowel/Gastric: Goal: Gastrointestinal status for postoperative course will improve Outcome: Progressing   Problem: Pain Management: Goal: General experience of comfort will improve Outcome: Progressing   Problem: Skin Integrity: Goal: Demonstration of wound healing without infection will improve Outcome: Progressing   Problem: Urinary Elimination: Goal: Ability to avoid or minimize complications of infection will improve Outcome: Progressing Goal: Ability to achieve and maintain urine output will improve Outcome: Progressing Goal: Home care management will improve Outcome: Progressing   Problem: Education: Goal: Knowledge of General Education information will improve Description: Including pain rating scale, medication(s)/side effects and non-pharmacologic comfort measures Outcome: Progressing   Problem: Health Behavior/Discharge Planning: Goal: Ability to manage health-related needs will improve Outcome: Progressing   Problem: Clinical Measurements: Goal: Ability to maintain clinical measurements within normal limits will improve Outcome: Progressing Goal: Will remain free from infection Outcome: Progressing Goal: Diagnostic test results will improve Outcome: Progressing Goal: Respiratory complications will improve Outcome: Progressing Goal: Cardiovascular complication will be avoided Outcome: Progressing   Problem: Activity: Goal: Risk for activity intolerance will decrease Outcome: Progressing   Problem: Nutrition: Goal: Adequate nutrition will be maintained Outcome: Progressing   Problem: Coping: Goal: Level of anxiety will decrease Outcome: Progressing   Problem: Elimination: Goal: Will not experience complications related to bowel  motility Outcome: Progressing Goal: Will not experience complications related to urinary retention Outcome: Progressing   Problem: Pain Managment: Goal: General experience of comfort will improve Outcome: Progressing   Problem: Safety: Goal: Ability to remain free from injury will improve Outcome: Progressing   Problem: Skin Integrity: Goal: Risk for impaired skin integrity will decrease Outcome: Progressing

## 2022-04-07 NOTE — Discharge Summary (Signed)
Date of admission: 04/06/2022  Date of discharge: 04/07/2022  Admission diagnosis: Prostate cancer  Discharge diagnosis: Same as above  Secondary diagnoses:  Patient Active Problem List   Diagnosis Date Noted   Prostate cancer ([place]) 04/06/2022   History and Physical: For full details, please see admission history and physical. Briefly, Dillon Humphrey is a 61 y.o. year old patient admitted on 04/06/2022 for scheduled robotic assisted laparoscopic radical prostatectomy, bilateral nerve sparing, with bilateral pelvic lymph node dissection with Dr. Erlene Quan.  He is accompanied today at the bedside by his wife.  He reports some postop abdominal pain worse with ambulation, R>L.  He has not yet passed flatus.  He is tolerating clears without difficulty.  He has ambulated.  Foley catheter in place draining clear, yellow urine.  WBC count up today, 6.9.  Hemoglobin down, 12.4.  Creatinine stable, 1.21.  He is afebrile, VSS.  Diet advanced this morning.  As of this afternoon, he is tolerating PO, pain is controlled, and he has ambulated again.  Physical Exam: Constitutional:  Alert and oriented, no acute distress, nontoxic appearing HEENT: [place], AT Cardiovascular: No clubbing, cyanosis, or edema Respiratory: Normal respiratory effort, no increased work of breathing GI: Surgical incisions noted over the anterior abdomen, all clean, dry, and intact with overlying surgical adhesive.  His abdomen is soft with appropriate postoperative R>L tenderness and without rigidity or rebound. Skin: No rashes, bruises or suspicious lesions Neurologic: Grossly intact, no focal deficits, moving all 4 extremities Psychiatric: Normal mood and affect   Hospital Course: Patient tolerated the procedure well.  He was then transferred to the floor after an uneventful PACU stay.  His hospital course was uncomplicated.  On POD#1 he had met discharge criteria: was eating a regular diet, was up and ambulating independently,  pain  was well controlled, catheter was draining well, and was ready for discharge.  Laboratory values:  Recent Labs    04/07/22 0553  WBC 6.9  HGB 12.4*  HCT 35.8*   Recent Labs    04/07/22 0553  NA 140  K 3.5  CL 110  CO2 24  GLUCOSE 106*  BUN 10  CREATININE 1.21  CALCIUM 8.3*   Results for orders placed or performed during the hospital encounter of 03/30/22  Urine Culture     Status: None   Collection Time: 03/30/22  8:34 AM   Specimen: Urine, Clean Catch  Result Value Ref Range Status   Specimen Description   Final    URINE, CLEAN CATCH Performed at [facility], [address], [place], [place] [address]    Special Requests   Final    NONE Performed at [facility], [address], [place], [place] [address]    Culture   Final    NO GROWTH Performed at [facility], [place] [address], [place],  [address]    Report Status 03/31/2022 FINAL  Final   Disposition: Home  Discharge instruction: The patient was instructed to be ambulatory but told to refrain from heavy lifting, strenuous activity, or driving.  Foley care instructions provided by nursing.  Discharge medications:  Allergies as of 04/07/2022   No Known Allergies      Medication List     TAKE these medications    docusate sodium 100 MG capsule Commonly known as: COLACE Take 1 capsule (100 mg total) by mouth 2 (two) times daily.   oxybutynin 5 MG tablet Commonly known as: DITROPAN Take 1 tablet (5 mg total)  by mouth every 8 (eight) hours as needed for bladder spasms.   oxyCODONE-acetaminophen 5-325 MG tablet Commonly known as: PERCOCET/ROXICET Take 1-2 tablets by mouth every 6 (six) hours as needed for moderate pain.        Followup:   Follow-up Information     Debroah Loop, PA-C Follow up on 04/13/2022.   Specialty: Urology Why: For catheter removal Contact information: [place] [address] [phone]

## 2022-04-07 NOTE — Anesthesia Postprocedure Evaluation (Signed)
Anesthesia Post Note  Patient: Dillon Humphrey  Procedure(s) Performed: XI ROBOTIC ASSISTED LAPAROSCOPIC RADICAL PROSTATECTOMY (Abdomen)  Patient location during evaluation: PACU Anesthesia Type: General Level of consciousness: awake and alert Pain management: pain level controlled Vital Signs Assessment: post-procedure vital signs reviewed and stable Respiratory status: spontaneous breathing, nonlabored ventilation, respiratory function stable and patient connected to nasal cannula oxygen Cardiovascular status: blood pressure returned to baseline and stable Postop Assessment: no apparent nausea or vomiting Anesthetic complications: no   No notable events documented.   Last Vitals:  Vitals:   04/07/22 0507 04/07/22 0747  BP: 101/66 114/71  Pulse: 68 67  Resp: 16 12  Temp: 37 C 36.7 C  SpO2: 95% 98%    Last Pain:  Vitals:   04/07/22 0747  TempSrc: Oral  PainSc: 2                  Martha Clan

## 2022-04-08 LAB — SURGICAL PATHOLOGY

## 2022-04-09 ENCOUNTER — Encounter: Payer: Self-pay | Admitting: Urology

## 2022-04-13 ENCOUNTER — Ambulatory Visit (INDEPENDENT_AMBULATORY_CARE_PROVIDER_SITE_OTHER): Payer: BC Managed Care – PPO | Admitting: Physician Assistant

## 2022-04-13 ENCOUNTER — Encounter: Payer: Self-pay | Admitting: Physician Assistant

## 2022-04-13 DIAGNOSIS — C61 Malignant neoplasm of prostate: Secondary | ICD-10-CM

## 2022-04-13 MED ORDER — SULFAMETHOXAZOLE-TRIMETHOPRIM 800-160 MG PO TABS
1.0000 | ORAL_TABLET | Freq: Once | ORAL | Status: AC
  Administered 2022-04-13: 1 via ORAL

## 2022-04-13 NOTE — Progress Notes (Signed)
Catheter Removal  Patient is present today for a catheter removal.  31m of water was drained from the balloon. A 18FR foley cath was removed from the bladder, no complications were noted. Patient tolerated well.  Performed by: SDebroah Loop PA-C   Additional notes: Bactrim DS x1 dose administered prior to Foley removal as above. Abdomen is soft with appropriate postoperative tenderness, no rigidity or rebound. Bowel function has returned.  We discussed surgical pathology findings of Gleason 3+3 acinar adenocarcinoma with bilateral bladder neck invasion and extraprostatic extension. No lymph nodes found within the specimen. We discussed that these findings are consistent with prior MR and biopsy results. Will keep plans for PSA in 6 weeks. He expressed understanding.  Letter provided today to return to work 4 weeks postop on light duty with no lifting >10lbs. He should refrain from heavy lifting until cleared by Dr. BErlene Quanto do so.  Follow up: 6 week postop f/u with PSA prior with Dr. BErlene Quan

## 2022-05-12 ENCOUNTER — Encounter: Payer: Self-pay | Admitting: Physical Therapy

## 2022-05-13 ENCOUNTER — Ambulatory Visit: Payer: BC Managed Care – PPO | Admitting: Physical Therapy

## 2022-05-15 ENCOUNTER — Other Ambulatory Visit: Payer: BC Managed Care – PPO

## 2022-05-19 ENCOUNTER — Ambulatory Visit: Payer: BC Managed Care – PPO | Admitting: Urology

## 2022-05-19 VITALS — BP 134/90 | HR 87 | Ht 71.0 in | Wt 175.0 lb

## 2022-05-19 DIAGNOSIS — N5231 Erectile dysfunction following radical prostatectomy: Secondary | ICD-10-CM

## 2022-05-19 DIAGNOSIS — C61 Malignant neoplasm of prostate: Secondary | ICD-10-CM

## 2022-05-19 DIAGNOSIS — N393 Stress incontinence (female) (male): Secondary | ICD-10-CM

## 2022-05-19 MED ORDER — SILDENAFIL CITRATE 100 MG PO TABS: 100.0000 mg | ORAL_TABLET | Freq: Every day | ORAL | 11 refills | Status: DC | PRN

## 2022-05-19 NOTE — Progress Notes (Addendum)
I, Jeanmarie Hubert Maxie,acting as a scribe for Hollice Espy, MD.,have documented all relevant documentation on the behalf of Hollice Espy, MD,as directed by  Hollice Espy, MD while in the presence of Hollice Espy, MD.   05/19/22 9:41 AM   Dillon Humphrey [birth year] [id number]  Referring provider: Derinda Late, MD [phone] [facility] [place],  [place] [address]  Chief Complaint  Patient presents with   Prostate Cancer    Follow up    HPI: 61 year-old male with a personal history of prostate cancer, here for his post-op visit.   He underwent a radical prostatectomy 04/06/22 for high volume Gleason 3+3 disease. He had an abnormal MRI preoperatively.   Surgical pathology on showed pT3A. He had evidence of non-focal extra prostatic extension, bladder neck invasion, negative seminal vesicles, positive margin noted.   His most recent PSA on 05/12/22 showed <0.01.  He reports his incisions are healing well. He notes that he is able to sleep through the night completely dry. He is no longer wearing diapers but 3-4 pas per day for incontinence.   No post operative erections today   PMH: Past Medical History:  Diagnosis Date   History of kidney stones     Surgical History: Past Surgical History:  Procedure Laterality Date   COLONOSCOPY  09/2021   lithotrypsi  2016   PROSTATE BIOPSY N/A 01/15/2022   Procedure: PROSTATE BIOPSY Vernelle Emerald;  Surgeon: Royston Cowper, MD;  Location: [facility] ORS;  Service: Urology;  Laterality: N/A;   PROSTATECTOMY  04/06/2022   ROBOT ASSISTED LAPAROSCOPIC RADICAL PROSTATECTOMY N/A 04/06/2022   Procedure: XI ROBOTIC ASSISTED LAPAROSCOPIC RADICAL PROSTATECTOMY;  Surgeon: Hollice Espy, MD;  Location: [facility] ORS;  Service: Urology;  Laterality: N/A;    Home Medications:  Allergies as of 05/19/2022   No Known Allergies      Medication List        Accurate as of May 19, 2022  9:41 AM. If you have  any questions, ask your nurse or doctor.          STOP taking these medications    docusate sodium 100 MG capsule Commonly known as: COLACE   oxybutynin 5 MG tablet Commonly known as: DITROPAN   oxyCODONE-acetaminophen 5-325 MG tablet Commonly known as: PERCOCET/ROXICET       TAKE these medications    sildenafil 100 MG tablet Commonly known as: VIAGRA Take 1 tablet (100 mg total) by mouth daily as needed for erectile dysfunction.        Family History: Family History  Problem Relation Age of Onset   Cancer Father 46       glioblasoma    Social History:  reports that he has never smoked. He has never used smokeless tobacco. He reports that he does not currently use alcohol. He reports that he does not currently use drugs.   Physical Exam: BP (!) 134/90   Pulse 87   Ht '5\' 11"'$  (1.803 m)   Wt 175 lb (79.4 kg)   BMI 24.41 kg/m   Constitutional:  Alert and oriented, No acute distress. HEENT: [place] AT, moist mucus membranes.  Trachea midline, no masses. Abdomen: Abdominal incisions well-healed, no hernias. Neurologic: Grossly intact, no focal deficits, moving all 4 extremities. Psychiatric: Normal mood and affect.  Assessment & Plan:    Prostate cancer - Negative pathologic features including bladder neck invasion and positive margins. However, PSA is undetectable. We will hold off on salvage  therapy for the time being.  - We'll repeat a PSA q3 months for the first year increasing frequency thereafter  2. Erectile Dysfunction following radical prostatectomy - Discussed penile rehab. He will start Sildenafil 100 mg daily as needed - Discussed intracavernosal injections and office protocol  3. Stress Incontinence -Anticipate improvement with time, continue to work with physical therapy   Return in about 6 months (around 11/18/2022) for with PSA.   [facility] [address], [place], [place] [address] [phone]   I  have reviewed the above documentation for accuracy and completeness, and I agree with the above.   Hollice Espy, MD

## 2022-05-19 NOTE — Patient Instructions (Addendum)
                                                                                                Trimix Injection Training  The provider will prescribe the Trimix medication to Dillon Humphrey.  You will need to pick up the medication at:   [address]  [place], [place] [address] [phone]  Once you have the medication in hand you will need to call our office to schedule a morning ONLY appointment to have injection teaching. The medication will induce an erection. The medication is estimated at $80.00. This medication is time sensitive as it expires quickly.   You will need to have a normal blood pressure in order to be injected. Your blood pressure must be under 140/90. We will check your blood pressure multiple times to ensure the reading is correct, if you BP continues to be elevated we will need to reschedule your appointment.

## 2022-05-20 ENCOUNTER — Ambulatory Visit: Payer: BC Managed Care – PPO | Attending: Urology | Admitting: Physical Therapy

## 2022-05-20 DIAGNOSIS — R278 Other lack of coordination: Secondary | ICD-10-CM | POA: Diagnosis present

## 2022-05-20 DIAGNOSIS — M533 Sacrococcygeal disorders, not elsewhere classified: Secondary | ICD-10-CM | POA: Insufficient documentation

## 2022-05-20 NOTE — Patient Instructions (Addendum)
   Clam Shell 45 Degrees  LEFT sideLying with hips and knees bent 45, one pillow between knees and ankles. Heel together, toes apart like ballerina,  Lift knee with exhale while pressing heels together. Be sure pelvis does not roll backward. Do not arch back. Do 20 times, R knee opens up , 2 times per day.    Complimentary stretch: Figure-4  on BOTH SIDES    Sit at 45 deg turn with R leg and knee on edge of chair/ bench, L buttock hanging off the edge to bring the L foot back like a lunge, toes bent, lower heel to feel quad stretch, pay attention to keeping pinky and first toe ballmound planted to align toes forward so ankles are not twerked   Repeat with other side  ___

## 2022-05-20 NOTE — Therapy (Signed)
OUTPATIENT PHYSICAL THERAPY EVALUATION    Patient Name: Dillon Humphrey MRN: [id number] DOB:[birth year], 61 y.o., male Today's Date: 05/20/2022   PT End of Session - 05/20/22 0810     Visit Number 1    Number of Visits 10    Date for PT Re-Evaluation 07/29/2022   PT Start Time 0804    PT Stop Time 0845    PT Time Calculation (min) 41 min    Activity Tolerance Patient tolerated treatment well    Behavior During Therapy [facility] for tasks assessed/performed             Past Medical History:  Diagnosis Date   History of kidney stones    Past Surgical History:  Procedure Laterality Date   COLONOSCOPY  09/2021   lithotrypsi  2016   PROSTATE BIOPSY N/A 01/15/2022   Procedure: PROSTATE BIOPSY Vernelle Emerald;  Surgeon: Royston Cowper, MD;  Location: [facility] ORS;  Service: Urology;  Laterality: N/A;   PROSTATECTOMY  04/06/2022   ROBOT ASSISTED LAPAROSCOPIC RADICAL PROSTATECTOMY N/A 04/06/2022   Procedure: XI ROBOTIC ASSISTED LAPAROSCOPIC RADICAL PROSTATECTOMY;  Surgeon: Hollice Espy, MD;  Location: [facility] ORS;  Service: Urology;  Laterality: N/A;   Patient Active Problem List   Diagnosis Date Noted   Prostate cancer ([place]) 04/06/2022    PCP: Baldemar Lenis  REFERRING PROVIDER:  Lemar Livings DIAG: Prostate Cancer   Rationale for Evaluation and Treatment Rehabilitation  THERAPY DIAG:  Other lack of coordination  Sacrococcygeal disorders, not elsewhere classified  ONSET DATE:   SUBJECTIVE:                   SUBJECTIVE STATEMENT ON EVAL                        Pt underwent surgery 04/06/22 RALP for prostate cancer. Pt is cleared from lifting restrictions as of yesterday. Pt changes 2-4 pads per day. Pt is no longer needing Depends. Pt notices leakage with more activities, being on his feet , and lifting. 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     PERTINENT HISTORY:  Family Hx of prostate cancer , Fx of ball of his R foot near big toes 10 years ago. Pt  wears an inserts.   Used to do push ups and squats.  Plays disc golf once a week with kids.   PAIN:  Are you having pain?No  PRECAUTIONS: None  WEIGHT BEARING RESTRICTIONS No  FALLS:  Has patient fallen in last 6 months? No  LIVING ENVIRONMENT: Lives with: lives with their spouse Lives in: House/apartment Stairs: Yes: External: 4 steps; none  OCCUPATION: Nurse, learning disability  PLOF: Independent  PATIENT GOALS  Control bladder leakage    HOME EXERCISE PROGRAM: See pt instruction section   OBJECTIVE:   [facility] PT Assessment - 05/20/22 0832       AROM   Overall AROM Comments R hip ext limited than L      Strength   Overall Strength Comments R LE hip / knee flexion 4-/5, LE 5/5,  R hip  abd 3/5, L 5/5 ,   L hip ext 3/5,   R scaption/ L glut 3/5 , L scaption / R glut 4/5 both with trunk perturbation '      Palpation   Spinal mobility no deviations, shoulders levelled    SI assessment  R iliac crest slightly higher, L SIJ hypomobile  [facility] Adult PT Treatment/Exercise - 05/20/22 1805       Neuro Re-ed    Neuro Re-ed Details  cued for hip strengthening exercises      Manual Therapy   Manual therapy comments STM/MWM to promote SIJ mobility              ASSESSMENT:  CLINICAL IMPRESSION:  Pt is a   61  yo male who underwent 04/06/22 RALP for prostate cancer. Pt is cleared from lifting restrictions as of yesterday. However, pt demo'd weakness with glut strength and trunk stability required for heavy lifting.              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  Pt is still experiencing urinary leakage.               Assessment showed:  _lack of understanding on exercises / body mechanics to ADL/ work/ fitness tasks that place less strain on the abdomen/pelvic floor _asymmetrical hip mm weakness in mm required for heavy lifting  _SIJ hypomobility   Pt was provided education on etiology of Sx with anatomy, physiology  explanation with images along with the benefits of customized pelvic PT Tx based on pt's medical conditions and musculoskeletal deficits.   Plan to progress with strengthening HEP at next session. Pt benefits from skilled PT.   OBJECTIVE IMPAIRMENTS decreased activity tolerance, decreased coordination, decreased endurance, decreased mobility, difficulty walking, decreased ROM, decreased strength, decreased safety awareness, hypomobility, increased muscle spasms, impaired flexibility, improper body mechanics, postural dysfunction, and pain.    ACTIVITY LIMITATIONS  self-care,  home chores, work tasks    PARTICIPATION LIMITATIONS:  community, work activities    [place]     are also affecting patient's functional outcome.    REHAB POTENTIAL: Good   CLINICAL DECISION MAKING: Evolving/moderate complexity   EVALUATION COMPLEXITY: Moderate    PATIENT EDUCATION:    Education details: Showed pt anatomy images. Explained muscles attachments/ connection, physiology of deep core system/ spinal- thoracic-pelvis-lower kinetic chain as they relate to pt's presentation, Sx, and past Hx. Explained what and how these areas of deficits need to be restored to balance and function    See Therapeutic activity / neuromuscular re-education section  Answered pt's questions.   Person educated: Patient Education method: Explanation, Demonstration, Tactile cues, Verbal cues, and Handouts Education comprehension: verbalized understanding, returned demonstration, verbal cues required, tactile cues required, and needs further education     PLAN: PT FREQUENCY: 1x/week   PT DURATION: 10  weeks   PLANNED INTERVENTIONS: Therapeutic exercises, Therapeutic activity, Neuromuscular re-education, Balance training, Gait training, Patient/Family education, Self Care, Joint mobilization, Spinal mobilization, Moist heat, Taping, and Manual therapy.   PLAN FOR NEXT SESSION: See clinical impression for plan      GOALS: Goals reviewed with patient? 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  Pt will demo > 5 pt change on FOTO  to improve QOL and function  PFDI Urinary - 38 pts  Urinary Problem  - 44 pts   Goal status: NEW    3.  Pt will demo proper body mechanics in against gravity tasks and ADLs  work tasks, fitness  to minimize straining pelvic floor / back                  Baseline: not IND, improper form that places strain on pelvic floor                Goal status:  NEW     4.  Pt will demo proper pelvic floor coordination to advance to kegel program to minimize incontinence Baseline:  no knowledge of kegels  Goal status: NEW   5. Pt will decrease pads from 2-4 per day to < 2 pads a day in order to improve QOL and hygiene  Baseline:  2-4 pads  Goal status: NEW   6. Pt will demo equal hip strength > 5/5 in order to minimize back injuries and minimize pelvic floor dysfunction with heavy lifting at work  Baseline:R LE hip / knee flexion 4-/5, LE 5/5,  R hip  abd 3/5, L 5/5 ,   L hip ext 3/5,   R scaption/ L glut 3/5 , L scaption / R glut 4/5 both with trunk perturbation Goal Status:  NEW     Jerl Mina, PT 05/20/2022, 8:11 AM

## 2022-05-27 ENCOUNTER — Ambulatory Visit: Payer: BC Managed Care – PPO | Admitting: Physical Therapy

## 2022-05-27 DIAGNOSIS — R278 Other lack of coordination: Secondary | ICD-10-CM | POA: Diagnosis not present

## 2022-05-27 DIAGNOSIS — M533 Sacrococcygeal disorders, not elsewhere classified: Secondary | ICD-10-CM

## 2022-05-27 NOTE — Patient Instructions (Signed)
CONTINUE WITH CLAM SHELLS BOTH SIDES THEN STRETCH figure -4 at edge of chair with pelvis tilted forward  __  Multifidis twist   BE SURE TO STACK POSTURE< WEAR YOUR CROWN< CHIN TUCK, FEET PLANTED   Band is on doorknob: sit facing perpendicular to door , sit halfway towards front of chair, firm through 4 points of contact at buttocks and feet. Feet are placed hip with apart.   Twisting trunk without moving the hips and knees Hold band at the level of ribcage, elbows bent,shoulder blades roll back and down like squeezing a pencil under armpit   Exhale twist,.10-15 deg away from door without moving your hips/ knees, press more weight on the side of the sitting bones/ foot opp of your direction of turn as your counterweight. Continue to maintain equal weight through legs.  Keep knee unlocked.  30 reps   __  Discontinue the leg lift  on belly position  ---> replace with     SKI against chair   In front of chair, knee against seat to line knee above ankle,  back foot hip width apart, push off through ballmounds,  opp arm up, thumbs up, chin tuck, shoulders down  Push ff through ballmounds , step back to under hip width   3 min

## 2022-05-27 NOTE — Therapy (Signed)
OUTPATIENT PHYSICAL THERAPY treatment   Patient Name: Dillon Humphrey MRN: [id number] DOB:04/29/1961, 61 y.o., male Today's Date: 05/27/2022   PT End of Session - 05/20/22 0810     Visit Number 1    Number of Visits 10    Date for PT Re-Evaluation 07/29/2022   PT Start Time 0804    PT Stop Time 0845    PT Time Calculation (min) 41 min    Activity Tolerance Patient tolerated treatment well    Behavior During Therapy [facility] for tasks assessed/performed             Past Medical History:  Diagnosis Date   History of kidney stones    Past Surgical History:  Procedure Laterality Date   COLONOSCOPY  09/2021   lithotrypsi  2016   PROSTATE BIOPSY N/A 01/15/2022   Procedure: PROSTATE BIOPSY Vernelle Emerald;  Surgeon: Royston Cowper, MD;  Location: [facility] ORS;  Service: Urology;  Laterality: N/A;   PROSTATECTOMY  04/06/2022   ROBOT ASSISTED LAPAROSCOPIC RADICAL PROSTATECTOMY N/A 04/06/2022   Procedure: XI ROBOTIC ASSISTED LAPAROSCOPIC RADICAL PROSTATECTOMY;  Surgeon: Hollice Espy, MD;  Location: [facility] ORS;  Service: Urology;  Laterality: N/A;   Patient Active Problem List   Diagnosis Date Noted   Prostate cancer ([place]) 04/06/2022    PCP: Baldemar Lenis  REFERRING PROVIDER:  Lemar Livings DIAG: Prostate Cancer   Rationale for Evaluation and Treatment Rehabilitation  THERAPY DIAG:  Other lack of coordination  Sacrococcygeal disorders, not elsewhere classified  ONSET DATE:   SUBJECTIVE:                     SUBJECTIVE STATEMENT TODAY:  Pt was not able to find a chair to practice his stretch on. And his bed is too high.                 SUBJECTIVE STATEMENT ON EVAL                        Pt underwent surgery 04/06/22 RALP for prostate cancer. Pt is cleared from lifting restrictions as of yesterday. Pt changes 2-4 pads per day. Pt is no longer needing Depends. Pt notices leakage with more activities, being on his feet , and lifting. 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     PERTINENT HISTORY:  Family Hx of prostate cancer , Fx of ball of his R foot near big toes 10 years ago. Pt wears an inserts.   Used to do push ups and squats.  Plays disc golf once a week with kids.   PAIN:  Are you having pain?No  PRECAUTIONS: None  WEIGHT BEARING RESTRICTIONS No  FALLS:  Has patient fallen in last 6 months? No  LIVING ENVIRONMENT: Lives with: lives with their spouse Lives in: House/apartment Stairs: Yes: External: 4 steps; none  OCCUPATION: Nurse, learning disability  PLOF: Independent  PATIENT GOALS  Control bladder leakage    HOME EXERCISE PROGRAM: See pt instruction section   OBJECTIVE:  [facility] PT Assessment - 05/27/22 1006       Observation/Other Assessments   Observations poor propioception of lower kinetic chain with upright thoracolumbar strengthening HEP , poor dissociation between trunk and pelvis/ BLE in multidifis twist HEP      Coordination   Coordination and Movement Description poor dissociation between trunk and pelvis/ BLE in multidifis twist HEP seated      Strength   Overall Strength  Comments R hip abd 4/5             [facility] Adult PT Treatment/Exercise - 05/27/22 1008       Neuro Re-ed    Neuro Re-ed Details  cued for poor propioception of lower kinetic chain with upright thoracolumbar strengthening HEP , poor dissociation between trunk and pelvis/ BLE in multidifis twist HEP , cued for cervical retracton to correct forward head     Exercises   Other Exercises  see pt instructions               ASSESSMENT:  CLINICAL IMPRESSION:   Pt's hip abduction strength improved from last week. Progressed with strengthening HEP to improve multifidis and thoracolumbar strengthening to minimzie LBP and build up to lifting caskets at work as Nurse, learning disability with minimized incontinence. Excessive cues for  cervical retracton to correct forward head. Plan to progress with kegel HEP at next session. Pt benefits from  skilled PT.   OBJECTIVE IMPAIRMENTS decreased activity tolerance, decreased coordination, decreased endurance, decreased mobility, difficulty walking, decreased ROM, decreased strength, decreased safety awareness, hypomobility, increased muscle spasms, impaired flexibility, improper body mechanics, postural dysfunction, and pain.    ACTIVITY LIMITATIONS  self-care,  home chores, work tasks    PARTICIPATION LIMITATIONS:  community, work activities    [place]     are also affecting patient's functional outcome.    REHAB POTENTIAL: Good   CLINICAL DECISION MAKING: Evolving/moderate complexity   EVALUATION COMPLEXITY: Moderate    PATIENT EDUCATION:    Education details: Showed pt anatomy images. Explained muscles attachments/ connection, physiology of deep core system/ spinal- thoracic-pelvis-lower kinetic chain as they relate to pt's presentation, Sx, and past Hx. Explained what and how these areas of deficits need to be restored to balance and function    See Therapeutic activity / neuromuscular re-education section  Answered pt's questions.   Person educated: Patient Education method: Explanation, Demonstration, Tactile cues, Verbal cues, and Handouts Education comprehension: verbalized understanding, returned demonstration, verbal cues required, tactile cues required, and needs further education     PLAN: PT FREQUENCY: 1x/week   PT DURATION: 10  weeks   PLANNED INTERVENTIONS: Therapeutic exercises, Therapeutic activity, Neuromuscular re-education, Balance training, Gait training, Patient/Family education, Self Care, Joint mobilization, Spinal mobilization, Moist heat, Taping, and Manual therapy.   PLAN FOR NEXT SESSION: See clinical impression for plan     GOALS: Goals reviewed with patient? Yes  SHORT TERM GOALS: Target date: 06/17/2022   Pt will demo IND with HEP                    Baseline: Not IND            Goal status: MET   LONG TERM GOALS: Target  date: 07/29/2022   1.Pt will compliant with multidifis strengthening and other resistance band strengthening exercises in order to minimize  pelvic floor function and promote ability to lift  with less leakage caskets as a Designer, industrial/product and return to doing push ups and squats with less strain on pelvic floor  Baseline:  Goal status:   2.  Pt will demo > 5 pt change on FOTO  to improve QOL and function  PFDI Urinary - 38 pts  Urinary Problem  - 44 pts   Goal status: NEW    3.  Pt will demo proper body mechanics in against gravity tasks and ADLs  work tasks, fitness  to minimize straining pelvic floor / back  Baseline: not IND, improper form that places strain on pelvic floor                Goal status:  NEW     4.  Pt will demo proper pelvic floor coordination to advance to kegel program to minimize incontinence Baseline:  no knowledge of kegels  Goal status: NEW   5. Pt will decrease pads from 2-4 per day to < 2 pads a day in order to improve QOL and hygiene  Baseline:  2-4 pads  Goal status: NEW   6. Pt will demo equal hip strength > 5/5 in order to minimize back injuries and minimize pelvic floor dysfunction with heavy lifting at work  Baseline:R LE hip / knee flexion 4-/5, LE 5/5,  R hip  abd 3/5, L 5/5 ,   L hip ext 3/5,   R scaption/ L glut 3/5 , L scaption / R glut 4/5 both with trunk perturbation Goal Status:  NEW     Jerl Mina, PT 05/27/2022, 8:45 AM

## 2022-06-03 ENCOUNTER — Ambulatory Visit: Payer: BC Managed Care – PPO | Attending: Urology | Admitting: Physical Therapy

## 2022-06-03 DIAGNOSIS — M533 Sacrococcygeal disorders, not elsewhere classified: Secondary | ICD-10-CM | POA: Diagnosis present

## 2022-06-03 DIAGNOSIS — R278 Other lack of coordination: Secondary | ICD-10-CM | POA: Diagnosis present

## 2022-06-03 NOTE — Patient Instructions (Addendum)
  Morning and night   Mon Tue Wed Thurs Fri  Sat Sun  Brushing stretch L / R - 15 reps   (sidelying)           Pelvic floor squeeze Quick 5 reps           Deep core level 1 +  quick   Deep core level 2  ( knee out)  63mn)           Pelvic floor squeeze \ Quick 5 reps             AFTER WORK:  Reclined cart seated  5 deep core level 1 breaths 5 quick pelvic floor squeezes   TOTALING 5 sets of 5 quick pelvic floor kegels in supine and reclined car seat without gravity this week)  ___   Avoid straining pelvic floor, abdominal muscles , spine  Use log rolling technique instead of getting out of bed with your neck or the sit-up     Log rolling into and out of bed   Log rolling into and out of bed If getting out of bed on R side, Bent knees, scoot hips/ shoulder to L  Raise R arm completely overhead, rolling onto armpit  Then lower bent knees to bed to get into complete side lying position  Then drop legs off bed, and push up onto R elbow/forearm, and use L hand to push onto the bed    Dig elbows and feet to lift hte buttocks and scoot without lifting head

## 2022-06-03 NOTE — Therapy (Signed)
OUTPATIENT PHYSICAL THERAPY treatment   Patient Name: Dillon Humphrey MRN: [id number] DOB:1960/09/09, 62 y.o., male Today's Date: 06/03/2022   PT End of Session - 05/20/22 0810     Visit Number 1    Number of Visits 10    Date for PT Re-Evaluation 07/29/2022   PT Start Time 0804    PT Stop Time 0845    PT Time Calculation (min) 41 min    Activity Tolerance Patient tolerated treatment well    Behavior During Therapy [facility] for tasks assessed/performed             Past Medical History:  Diagnosis Date   History of kidney stones    Past Surgical History:  Procedure Laterality Date   COLONOSCOPY  09/2021   lithotrypsi  2016   PROSTATE BIOPSY N/A 01/15/2022   Procedure: PROSTATE BIOPSY Vernelle Emerald;  Surgeon: Royston Cowper, MD;  Location: [facility] ORS;  Service: Urology;  Laterality: N/A;   PROSTATECTOMY  04/06/2022   ROBOT ASSISTED LAPAROSCOPIC RADICAL PROSTATECTOMY N/A 04/06/2022   Procedure: XI ROBOTIC ASSISTED LAPAROSCOPIC RADICAL PROSTATECTOMY;  Surgeon: Hollice Espy, MD;  Location: [facility] ORS;  Service: Urology;  Laterality: N/A;   Patient Active Problem List   Diagnosis Date Noted   Prostate cancer ([place]) 04/06/2022    PCP: Baldemar Lenis  REFERRING PROVIDER:  Lemar Livings DIAG: Prostate Cancer   Rationale for Evaluation and Treatment Rehabilitation  THERAPY DIAG:  Other lack of coordination  Sacrococcygeal disorders, not elsewhere classified  ONSET DATE:   SUBJECTIVE:                     SUBJECTIVE STATEMENT TODAY:  Pt notices more leakage with more activity but pt was able to play freesbie golf with son. Pt was worried at first about leakage but it was manageable and he had the energy and endurance and flexibility to play complete course  for 2 hours.                SUBJECTIVE STATEMENT ON EVAL                        Pt underwent surgery 04/06/22 RALP for prostate cancer. Pt is cleared from lifting restrictions as of yesterday. Pt changes 2-4 pads per day. Pt is  no longer needing Depends. Pt notices leakage with more activities, being on his feet , and lifting. 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     PERTINENT HISTORY:  Family Hx of prostate cancer , Fx of ball of his R foot near big toes 10 years ago. Pt wears an inserts.   Used to do push ups and squats.  Plays disc golf once a week with kids.   PAIN:  Are you having pain?No  PRECAUTIONS: None  WEIGHT BEARING RESTRICTIONS No  FALLS:  Has patient fallen in last 6 months? No  LIVING ENVIRONMENT: Lives with: lives with their spouse Lives in: House/apartment Stairs: Yes: External: 4 steps; none  OCCUPATION: Nurse, learning disability  PLOF: Independent  PATIENT GOALS  Control bladder leakage    HOME EXERCISE PROGRAM: See pt instruction section   OBJECTIVE:   Pelvic Floor Special Questions - 06/03/22 0945     External Perineal Exam through clothing: 3 sec, 3 reps quick contractions proper lengthening  abdominal overuse        [facility] Adult PT Treatment/Exercise - 06/03/22 1001       Therapeutic  Activites    Other Therapeutic Activities explained spreading out quick pelvic floor contractions throughout the day and created a chart , modified kegel practice into his work routine      Neuro Re-ed    Neuro Re-ed Details  cued for less ab overuse      Exercises   Other Exercises  see pt instructions              ASSESSMENT:  CLINICAL IMPRESSION:       Pt was able to play freesbie golf with son. Pt was worried at first about leakage but it was manageable and he had the energy and endurance and flexibility to play complete course  for 2 hours .     Pt is showing improved trunk stability with compliance with posterior chain strengthening.   Pt advanced to quick pelvic floor with improved technique. Explained spreading out quick pelvic floor contractions throughout the day and created a chart , modified kegel practice into his work routine. Cued  for less ab overuse and correct technique with deep core level 2.  Plan to add cervical/ scapular strengthening at next session. Progress to endurance kegel program .  Pt benefits from skilled PT.   OBJECTIVE IMPAIRMENTS decreased activity tolerance, decreased coordination, decreased endurance, decreased mobility, difficulty walking, decreased ROM, decreased strength, decreased safety awareness, hypomobility, increased muscle spasms, impaired flexibility, improper body mechanics, postural dysfunction, and pain.    ACTIVITY LIMITATIONS  self-care,  home chores, work tasks    PARTICIPATION LIMITATIONS:  community, work activities    [place]     are also affecting patient's functional outcome.    REHAB POTENTIAL: Good   CLINICAL DECISION MAKING: Evolving/moderate complexity   EVALUATION COMPLEXITY: Moderate    PATIENT EDUCATION:    Education details: Showed pt anatomy images. Explained muscles attachments/ connection, physiology of deep core system/ spinal- thoracic-pelvis-lower kinetic chain as they relate to pt's presentation, Sx, and past Hx. Explained what and how these areas of deficits need to be restored to balance and function    See Therapeutic activity / neuromuscular re-education section  Answered pt's questions.   Person educated: Patient Education method: Explanation, Demonstration, Tactile cues, Verbal cues, and Handouts Education comprehension: verbalized understanding, returned demonstration, verbal cues required, tactile cues required, and needs further education     PLAN: PT FREQUENCY: 1x/week   PT DURATION: 10  weeks   PLANNED INTERVENTIONS: Therapeutic exercises, Therapeutic activity, Neuromuscular re-education, Balance training, Gait training, Patient/Family education, Self Care, Joint mobilization, Spinal mobilization, Moist heat, Taping, and Manual therapy.   PLAN FOR NEXT SESSION: See clinical impression for plan     GOALS: Goals reviewed  with patient? 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  Pt will demo > 5 pt change on FOTO  to improve QOL and function  PFDI Urinary - 38 pts  Urinary Problem  - 44 pts   Goal status: NEW    3.  Pt will demo proper body mechanics in against gravity tasks and ADLs  work tasks, fitness  to minimize straining pelvic floor / back                  Baseline: not IND, improper form that places strain on pelvic floor                Goal status:  NEW     4.  Pt will demo proper pelvic floor coordination to advance to kegel program to minimize incontinence Baseline:  no knowledge of kegels  Goal status: NEW   5. Pt will decrease pads from 2-4 per day to < 2 pads a day in order to improve QOL and hygiene  Baseline:  2-4 pads  Goal status: NEW   6. Pt will demo equal hip strength > 5/5 in order to minimize back injuries and minimize pelvic floor dysfunction with heavy lifting at work  Baseline:R LE hip / knee flexion 4-/5, LE 5/5,  R hip  abd 3/5, L 5/5 ,   L hip ext 3/5,   R scaption/ L glut 3/5 , L scaption / R glut 4/5 both with trunk perturbation Goal Status:  NEW     Jerl Mina, PT 06/03/2022, 8:55 AM

## 2022-06-24 ENCOUNTER — Encounter: Payer: BC Managed Care – PPO | Admitting: Physical Therapy

## 2022-06-25 ENCOUNTER — Ambulatory Visit: Payer: BC Managed Care – PPO | Admitting: Physical Therapy

## 2022-06-25 DIAGNOSIS — R278 Other lack of coordination: Secondary | ICD-10-CM

## 2022-06-25 DIAGNOSIS — M533 Sacrococcygeal disorders, not elsewhere classified: Secondary | ICD-10-CM

## 2022-06-25 NOTE — Therapy (Signed)
OUTPATIENT PHYSICAL THERAPY treatment  / Discharge Summary across 9 visits    Patient Name: Dillon Humphrey MRN: [id number] DOB:11-09-1960, 62 y.o., male Today's Date: 06/25/2022   PT End of Session - 05/20/22 0810     Visit Number 1    Number of Visits 10    Date for PT Re-Evaluation 07/29/2022   PT Start Time 0804    PT Stop Time 0845    PT Time Calculation (min) 41 min    Activity Tolerance Patient tolerated treatment well    Behavior During Therapy [facility] for tasks assessed/performed             Past Medical History:  Diagnosis Date   History of kidney stones    Past Surgical History:  Procedure Laterality Date   COLONOSCOPY  09/2021   lithotrypsi  2016   PROSTATE BIOPSY N/A 01/15/2022   Procedure: PROSTATE BIOPSY Vernelle Emerald;  Surgeon: Royston Cowper, MD;  Location: [facility] ORS;  Service: Urology;  Laterality: N/A;   PROSTATECTOMY  04/06/2022   ROBOT ASSISTED LAPAROSCOPIC RADICAL PROSTATECTOMY N/A 04/06/2022   Procedure: XI ROBOTIC ASSISTED LAPAROSCOPIC RADICAL PROSTATECTOMY;  Surgeon: Hollice Espy, MD;  Location: [facility] ORS;  Service: Urology;  Laterality: N/A;   Patient Active Problem List   Diagnosis Date Noted   Prostate cancer ([place]) 04/06/2022    PCP: Baldemar Lenis  REFERRING PROVIDER:  Lemar Livings DIAG: Prostate Cancer   Rationale for Evaluation and Treatment Rehabilitation  THERAPY DIAG:  Other lack of coordination  Sacrococcygeal disorders, not elsewhere classified  ONSET DATE:   SUBJECTIVE:                     SUBJECTIVE STATEMENT TODAY:  Pt notices he is wearing only one pad a day . Pt has increased kegels 8 quick contractions seated, 3 sec, 5 reps.                SUBJECTIVE STATEMENT ON EVAL                        Pt underwent surgery 04/06/22 RALP for prostate cancer. Pt is cleared from lifting restrictions as of yesterday. Pt changes 2-4 pads per day. Pt is no longer needing Depends. Pt notices leakage with more activities, being on his feet ,  and lifting. 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     PERTINENT HISTORY:  Family Hx of prostate cancer , Fx of ball of his R foot near big toes 10 years ago. Pt wears an inserts.   Used to do push ups and squats.  Plays disc golf once a week with kids.   PAIN:  Are you having pain?No  PRECAUTIONS: None  WEIGHT BEARING RESTRICTIONS No  FALLS:  Has patient fallen in last 6 months? No  LIVING ENVIRONMENT: Lives with: lives with their spouse Lives in: House/apartment Stairs: Yes: External: 4 steps; none  OCCUPATION: Nurse, learning disability  PLOF: Independent  PATIENT GOALS  Control bladder leakage    HOME EXERCISE PROGRAM: See pt instruction section   OBJECTIVE:   [facility] PT Assessment - 06/25/22 0813       Strength   Overall Strength Comments R hip flex/ ext 4-/5, L 5/5,  B hip abd 4/5      Palpation   SI assessment  supine: R ASIS more anterior    Palpation comment hypomobile R SIJ  [facility] Adult PT Treatment/Exercise - 06/25/22 1013       Therapeutic Activites    Other Therapeutic Activities discussed HEP as pt self-d/c      Neuro Re-ed    Neuro Re-ed Details  cued for SIJ stretches      Manual Therapy   Manual therapy comments PA mob at SIJ R, to promote iliac crest ER                CLINICAL IMPRESSION:    Pt has achieved 100% of his goals. Pt is now wearing only one pad a day.     Pt was able to play freesbie golf with son. Pt was worried at first about leakage but it was manageable and he had the energy and endurance and flexibility to play complete course  for 2 hours .     Pt is showing improved trunk stability, proper deep core mm strength, improved posture, increased hip abduction. Pt showed improved R SIJ mobility after manual Tx today which helped to increase R hip ext/ flexion. Pt   would benefit from glut strengthening, squats , lat strengthening for lifting at work heavy caskets but     pt is  self-d/c at this time due to financial reasons.   Pt benefits from skilled PT.   OBJECTIVE IMPAIRMENTS decreased activity tolerance, decreased coordination, decreased endurance, decreased mobility, difficulty walking, decreased ROM, decreased strength, decreased safety awareness, hypomobility, increased muscle spasms, impaired flexibility, improper body mechanics, postural dysfunction, and pain.    ACTIVITY LIMITATIONS  self-care,  home chores, work tasks    PARTICIPATION LIMITATIONS:  community, work activities    [place]     are also affecting patient's functional outcome.    REHAB POTENTIAL: Good   CLINICAL DECISION MAKING: Evolving/moderate complexity   EVALUATION COMPLEXITY: Moderate    PATIENT EDUCATION:    Education details: Showed pt anatomy images. Explained muscles attachments/ connection, physiology of deep core system/ spinal- thoracic-pelvis-lower kinetic chain as they relate to pt's presentation, Sx, and past Hx. Explained what and how these areas of deficits need to be restored to balance and function    See Therapeutic activity / neuromuscular re-education section  Answered pt's questions.   Person educated: Patient Education method: Explanation, Demonstration, Tactile cues, Verbal cues, and Handouts Education comprehension: verbalized understanding, returned demonstration, verbal cues required, tactile cues required, and needs further education     PLAN: PT FREQUENCY: 1x/week   PT DURATION: 10  weeks   PLANNED INTERVENTIONS: Therapeutic exercises, Therapeutic activity, Neuromuscular re-education, Balance training, Gait training, Patient/Family education, Self Care, Joint mobilization, Spinal mobilization, Moist heat, Taping, and Manual therapy.   PLAN FOR NEXT SESSION: See clinical impression for plan     GOALS: Goals reviewed with patient? 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  Pt will demo >  5 pt change on FOTO  to improve QOL and function  PFDI Urinary - 38 pts   -> 12.5 pts Urinary Problem  - 44 pts  -> 54    Goal status: MET  3.  Pt will demo proper body mechanics in against gravity tasks and ADLs  work tasks, fitness  to minimize straining pelvic floor / back                  Baseline: not IND, improper form that places strain on pelvic floor                Goal status:  MET     4.  Pt will demo proper pelvic floor coordination to advance to kegel program to minimize incontinence Baseline:  no knowledge of kegels  Goal status: MET (  kegels 8 quick contractions seated, 3 sec, 5 reps. 06/25/22)   5. Pt will decrease pads from 2-4 per day to < 2 pads a day in order to improve QOL and hygiene  Baseline:  2-4 pads  Goal status: MET (06/25/22: 1 pad)   6. Pt will demo equal hip strength > 5/5 in order to minimize back injuries and minimize pelvic floor dysfunction with heavy lifting at work  Baseline:R LE hip / knee flexion 4-/5, LE 5/5,  R hip  abd 3/5, L 5/5 ,   L hip ext 3/5,   R scaption/ L glut 3/5 , L scaption / R glut 4/5 both with trunk perturbation   Goal Status: MET  ( 06/25/22:  R hip abd/ flex/ ext 4/5, less perturbation of trunk with posterior sling)    Jerl Mina, PT 06/25/2022, 8:08 AM

## 2022-06-25 NOTE — Patient Instructions (Signed)
Stretch for pelvic floor    On belly: Riding horse edge of mattress  knee bent like riding a horse, move knee towards armpit and out  10 reps  __  Ardine Eng pose rocking   Toes tucked, shoulders down and back, on forearms , hands shoulder width apart, fingers straight, elbow back , squeeze imaginary pencils in armpit, shoulder down and away from ears  10 reps

## 2022-08-17 ENCOUNTER — Other Ambulatory Visit: Payer: BC Managed Care – PPO

## 2022-08-19 ENCOUNTER — Other Ambulatory Visit: Payer: BC Managed Care – PPO

## 2022-08-19 DIAGNOSIS — N5231 Erectile dysfunction following radical prostatectomy: Secondary | ICD-10-CM

## 2022-08-19 DIAGNOSIS — C61 Malignant neoplasm of prostate: Secondary | ICD-10-CM

## 2022-08-20 ENCOUNTER — Encounter: Payer: Self-pay | Admitting: Urology

## 2022-08-20 LAB — PSA: Prostate Specific Ag, Serum: <0.1 ng/mL (ref 0.0–4.0)

## 2022-11-06 ENCOUNTER — Encounter: Payer: Self-pay | Admitting: Urology

## 2022-11-10 ENCOUNTER — Other Ambulatory Visit: Payer: BC Managed Care – PPO

## 2022-11-10 DIAGNOSIS — C61 Malignant neoplasm of prostate: Secondary | ICD-10-CM

## 2022-11-10 DIAGNOSIS — N5231 Erectile dysfunction following radical prostatectomy: Secondary | ICD-10-CM

## 2022-11-11 ENCOUNTER — Ambulatory Visit (INDEPENDENT_AMBULATORY_CARE_PROVIDER_SITE_OTHER): Payer: BC Managed Care – PPO | Admitting: Urology

## 2022-11-11 VITALS — BP 134/88 | HR 69 | Ht 71.0 in | Wt 174.0 lb

## 2022-11-11 DIAGNOSIS — Z8546 Personal history of malignant neoplasm of prostate: Secondary | ICD-10-CM

## 2022-11-11 DIAGNOSIS — N393 Stress incontinence (female) (male): Secondary | ICD-10-CM | POA: Diagnosis not present

## 2022-11-11 DIAGNOSIS — N5231 Erectile dysfunction following radical prostatectomy: Secondary | ICD-10-CM | POA: Diagnosis not present

## 2022-11-11 DIAGNOSIS — C61 Malignant neoplasm of prostate: Secondary | ICD-10-CM

## 2022-11-11 LAB — PSA: Prostate Specific Ag, Serum: <0.1 ng/mL (ref 0.0–4.0)

## 2022-11-11 MED ORDER — SILDENAFIL CITRATE 20 MG PO TABS: ORAL_TABLET | ORAL | 11 refills | Status: AC

## 2022-11-11 NOTE — Progress Notes (Signed)
I,Amy L Pierron,acting as a scribe for Vanna Scotland, MD.,have documented all relevant documentation on the behalf of Vanna Scotland, MD,as directed by  Vanna Scotland, MD while in the presence of Vanna Scotland, MD.  11/11/2022 9:10 AM   Dillon Humphrey 06/17/1960 [id number]  Referring provider: Kandyce Rud, MD [phone] [address] [place],  [place] [address]  Chief Complaint  Patient presents with   Prostate Cancer    HPI:  62 year-old male with a personal history of prostate cancer, s/p robotic radical prostatectomy, presents today for six month follow up.   He underwent a radical prostatectomy 04/06/22 for high volume Gleason 3+3 disease. He had an abnormal MRI preoperatively.    Surgical pathology on showed pT3A. He had evidence of non-focal extra prostatic extension, bladder neck invasion, negative seminal vesicles, positive margin noted.  As of 11/10/2022 his PSA remains undetectable.  He mentions his leakage is minimal and not bothersome. He is no longer wearing any pads. On occasion he has the urge to urinate but when he tries he doesn't go or has a weak stream. Also, he has some retraction during urination so he sits down. Overall though, he said he is doing well.  He was getting headaches and diarrhea from the daily Viagra so he stopped taking it. He has not had any fullness or erections yet.   PMH: Past Medical History:  Diagnosis Date   History of kidney stones     Surgical History: Past Surgical History:  Procedure Laterality Date   COLONOSCOPY  09/2021   lithotrypsi  2016   PROSTATE BIOPSY N/A 01/15/2022   Procedure: PROSTATE BIOPSY Addison Bailey;  Surgeon: Orson Ape, MD;  Location: [facility];  Service: Urology;  Laterality: N/A;   PROSTATECTOMY  04/06/2022   ROBOT ASSISTED LAPAROSCOPIC RADICAL PROSTATECTOMY N/A 04/06/2022   Procedure: XI ROBOTIC ASSISTED LAPAROSCOPIC RADICAL PROSTATECTOMY;  Surgeon: Vanna Scotland, MD;  Location: [facility] OR[facility];  Service: Urology;  Laterality: N/A;    Home Medications:  Allergies as of 11/11/2022   No Known Allergies      Medication List        Accurate as of November 11, 2022  9:10 AM. If you have any questions, ask your nurse or doctor.          STOP taking these medications    sildenafil 100 MG tablet Commonly known as: VIAGRA Stopped by: Vanna Scotland, MD       TAKE these medications    sildenafil 20 MG tablet Commonly known as: Revatio Take 1 to 5 tables as needed 1 hour prior to intercourse Started by: Vanna Scotland, MD        Family History: Family History  Problem Relation Age of Onset   Cancer Father 32       glioblasoma    Social History:  reports that he has never smoked. He has never used smokeless tobacco. He reports that he does not currently use alcohol. He reports that he does not currently use drugs.   Physical Exam: BP 134/88   Pulse 69   Ht 5\' 11"  (1.803 m)   Wt 174 lb (78.9 kg)   BMI 24.27 kg/m   Constitutional:  Alert and oriented, No acute distress. HEENT: [place] AT, moist mucus membranes.  Trachea midline, no masses. Neurologic: Grossly intact, no focal deficits, moving all 4 extremities. Psychiatric: Normal mood and affect.   Assessment & Plan:    Prostate cancer NED  -  Given high risk features of his surgical pathology, will continue to check a PSA every 3 months for the first year and then check every 6 months.  2. Erectile dysfunction  - We discussed the pathophysiology of erectile dysfunction today along with possible contributing factors. Discussed possible treatment options including PDE 5 inhibitors, vacuum erectile device, intracavernosal injection, MUSE, and placement of the inflatable or malleable penile prosthesis for refractory cases.  Micah Flesher over details about Trimix and the cost. He will let us know if he wants to proceed. In the meantime,  -sent script for Viagra 20mg  to be taken daily.   Will try lower dose and see if he tolerates this to promote increased nitrous oxide, promote healing etc.  3. Stress incontinence  - Minimal, not bothersome.   - Encourage penile stretching and discussed the use of a vacuum erection device to help with the retraction.   Return in about 3 months (around 02/11/2023) for PSA.  I have reviewed the above documentation for accuracy and completeness, and I agree with the above.   Vanna Scotland, MD  [facility] [address] [place], [place] [address] [phone]  I spent 33 total minutes on the day of the encounter including pre-visit review of the medical record, face-to-face time with the patient, and post visit ordering of labs/imaging/tests.

## 2022-11-12 ENCOUNTER — Other Ambulatory Visit: Payer: BC Managed Care – PPO

## 2022-11-17 ENCOUNTER — Ambulatory Visit: Payer: BC Managed Care – PPO | Admitting: Urology

## 2023-02-11 ENCOUNTER — Other Ambulatory Visit: Payer: BC Managed Care – PPO

## 2023-02-11 DIAGNOSIS — C61 Malignant neoplasm of prostate: Secondary | ICD-10-CM

## 2023-02-12 LAB — PSA: Prostate Specific Ag, Serum: <0.1 ng/mL (ref 0.0–4.0)

## 2023-05-11 ENCOUNTER — Other Ambulatory Visit: Payer: BC Managed Care – PPO

## 2023-05-11 DIAGNOSIS — C61 Malignant neoplasm of prostate: Secondary | ICD-10-CM

## 2023-05-12 LAB — PSA: Prostate Specific Ag, Serum: <0.1 ng/mL (ref 0.0–4.0)

## 2023-05-13 ENCOUNTER — Other Ambulatory Visit: Payer: BC Managed Care – PPO

## 2023-11-03 ENCOUNTER — Other Ambulatory Visit: Payer: BC Managed Care – PPO

## 2023-11-03 DIAGNOSIS — C61 Malignant neoplasm of prostate: Secondary | ICD-10-CM

## 2023-11-04 LAB — PSA: Prostate Specific Ag, Serum: <0.1 ng/mL (ref 0.0–4.0)

## 2023-11-05 ENCOUNTER — Other Ambulatory Visit: Payer: Self-pay

## 2023-11-09 ENCOUNTER — Ambulatory Visit: Payer: Self-pay | Admitting: Urology

## 2023-11-09 VITALS — BP 147/85 | HR 69 | Ht 71.0 in | Wt 175.0 lb

## 2023-11-09 DIAGNOSIS — C61 Malignant neoplasm of prostate: Secondary | ICD-10-CM

## 2023-11-09 DIAGNOSIS — N5231 Erectile dysfunction following radical prostatectomy: Secondary | ICD-10-CM

## 2023-11-09 DIAGNOSIS — N393 Stress incontinence (female) (male): Secondary | ICD-10-CM

## 2023-11-09 NOTE — Progress Notes (Signed)
 I,Amy L Pierron,acting as a scribe for Dustin Gimenez, MD.,have documented all relevant documentation on the behalf of Dustin Gimenez, MD,as directed by  Dustin Gimenez, MD while in the presence of Dustin Gimenez, MD.  11/09/2023 1:07 PM   Dillon Humphrey [birth year] [id number]  Referring provider: Nestor Banter, MD [phone] [facility] [place],  [place] [address]  Chief Complaint  Patient presents with   Prostate Cancer    HPI: 63 year-old male with a personal history of prostate cancer presents today for a routine annual follow-up.   He underwent a radical prostatectomy 04/06/22 for high volume Gleason 3+3 disease. He had an abnormal MRI preoperatively.    Surgical pathology on showed pT3A. He had evidence of non-focal extra prostatic extension, bladder neck invasion, negative seminal vesicles, positive margin noted.  He worked with physical therapy in January to address continence issues.   His most recent PSA from 11/03/2023 remains undetectable.  He reports minimal stress urinary incontinence, describing occasional small leaks when in certain positions or during activities such as playing with grandchildren. He does not require pads regularly and has not used them recently. He notes that physical therapy was beneficial.   Regarding erectile function, he reports no significant erections and did not tolerate Viagra  well despite dosage adjustments. He expresses no interest in pursuing further interventions such as injections or implants at this time.   PMH: Past Medical History:  Diagnosis Date   History of kidney stones     Surgical History: Past Surgical History:  Procedure Laterality Date   COLONOSCOPY  09/2021   lithotrypsi  2016   PROSTATE BIOPSY N/A 01/15/2022   Procedure: PROSTATE BIOPSY Ali Ink;  Surgeon: Rea Cambridge, MD;  Location: [facility];  Service: Urology;  Laterality: N/A;   PROSTATECTOMY  04/06/2022   ROBOT  ASSISTED LAPAROSCOPIC RADICAL PROSTATECTOMY N/A 04/06/2022   Procedure: XI ROBOTIC ASSISTED LAPAROSCOPIC RADICAL PROSTATECTOMY;  Surgeon: Dustin Gimenez, MD;  Location: [facility];  Service: Urology;  Laterality: N/A;    Home Medications:  Allergies as of 11/09/2023   No Known Allergies      Medication List        Accurate as of November 09, 2023  1:07 PM. If you have any questions, ask your nurse or doctor.          sildenafil  20 MG tablet Commonly known as: Revatio  Take 1 to 5 tables as needed 1 hour prior to intercourse        Family History: Family History  Problem Relation Age of Onset   Cancer Father 48       glioblasoma    Social History:  reports that he has never smoked. He has never used smokeless tobacco. He reports that he does not currently use alcohol. He reports that he does not currently use drugs.   Physical Exam: BP (!) 147/85   Pulse 69   Ht 5\' 11"  (1.803 m)   Wt 175 lb (79.4 kg)   BMI 24.41 kg/m   Constitutional:  Alert and oriented, No acute distress. HEENT: [place] AT, moist mucus membranes.  Trachea midline, no masses. Neurologic: Grossly intact, no focal deficits, moving all 4 extremities. Psychiatric: Normal mood and affect.   Assessment & Plan:    1. Prostate Cancer - PSA remains undetectable, indicating no current evidence of recurrence. The plan is to continue monitoring PSA levels every six months. If PSA becomes detectable, recurrence is defined at 0.2, and  salvage radiation would be considered if PSA approaches 1.  2. Stress Urinary Incontinence - Minimal, excellent outcome. No further intervention is planned at this time.  3. Erectile Dysfunction - Has not tolerated PDE-5 inhibitors and reports no significant improvement. He is not interested in pursuing injections or a penile prosthesis at this time. Informed about the surgical option and the potential for insurance coverage if he decides to pursue it in the future. Explained he would  be referred to Dr. Manchin since he does these surgeries routinely.  Return in about 1 year (around 11/08/2024) for PSA.  [facility] [address] [place] [address], [address] [place], [place] [address] [phone]

## 2024-05-08 ENCOUNTER — Other Ambulatory Visit

## 2024-05-08 DIAGNOSIS — C61 Malignant neoplasm of prostate: Secondary | ICD-10-CM

## 2024-05-09 ENCOUNTER — Ambulatory Visit: Payer: Self-pay | Admitting: Urology

## 2024-05-09 ENCOUNTER — Other Ambulatory Visit

## 2024-05-09 LAB — PSA: Prostate Specific Ag, Serum: <0.1 ng/mL (ref 0.0–4.0)

## 2024-10-31 ENCOUNTER — Other Ambulatory Visit

## 2024-11-07 ENCOUNTER — Ambulatory Visit: Admitting: Urology
